# Patient Record
Sex: Female | Born: 1975 | Race: White | Hispanic: No | Marital: Single | Smoking: Current every day smoker
Health system: Southern US, Community
[De-identification: ages and names within clinical notes are randomized; demographics above are authoritative.]

## PROBLEM LIST (undated history)

## (undated) DIAGNOSIS — E039 Hypothyroidism, unspecified: Secondary | ICD-10-CM

## (undated) DIAGNOSIS — G8929 Other chronic pain: Secondary | ICD-10-CM

## (undated) DIAGNOSIS — R102 Pelvic and perineal pain: Secondary | ICD-10-CM

## (undated) DIAGNOSIS — M722 Plantar fascial fibromatosis: Secondary | ICD-10-CM

## (undated) DIAGNOSIS — K219 Gastro-esophageal reflux disease without esophagitis: Secondary | ICD-10-CM

## (undated) DIAGNOSIS — F329 Major depressive disorder, single episode, unspecified: Secondary | ICD-10-CM

## (undated) DIAGNOSIS — F32A Depression, unspecified: Secondary | ICD-10-CM

## (undated) DIAGNOSIS — K760 Fatty (change of) liver, not elsewhere classified: Secondary | ICD-10-CM

## (undated) DIAGNOSIS — IMO0002 Reserved for concepts with insufficient information to code with codable children: Secondary | ICD-10-CM

## (undated) DIAGNOSIS — R0789 Other chest pain: Secondary | ICD-10-CM

## (undated) DIAGNOSIS — E119 Type 2 diabetes mellitus without complications: Secondary | ICD-10-CM

## (undated) DIAGNOSIS — N809 Endometriosis, unspecified: Secondary | ICD-10-CM

## (undated) DIAGNOSIS — E785 Hyperlipidemia, unspecified: Secondary | ICD-10-CM

## (undated) HISTORY — DX: Gastro-esophageal reflux disease without esophagitis: K21.9

## (undated) HISTORY — DX: Major depressive disorder, single episode, unspecified: F32.9

## (undated) HISTORY — PX: WISDOM TOOTH EXTRACTION: SHX21

## (undated) HISTORY — DX: Fatty (change of) liver, not elsewhere classified: K76.0

## (undated) HISTORY — PX: OTHER SURGICAL HISTORY: SHX169

## (undated) HISTORY — DX: Type 2 diabetes mellitus without complications: E11.9

## (undated) HISTORY — DX: Plantar fascial fibromatosis: M72.2

## (undated) HISTORY — PX: CHOLECYSTECTOMY: SHX55

## (undated) HISTORY — PX: TUBAL LIGATION: SHX77

## (undated) HISTORY — DX: Depression, unspecified: F32.A

## (undated) HISTORY — DX: Other chest pain: R07.89

## (undated) HISTORY — DX: Hyperlipidemia, unspecified: E78.5

## (undated) HISTORY — DX: Hypothyroidism, unspecified: E03.9

---

## 2001-02-11 ENCOUNTER — Encounter: Payer: Self-pay | Admitting: General Surgery

## 2001-02-16 ENCOUNTER — Inpatient Hospital Stay (HOSPITAL_COMMUNITY): Admission: RE | Admit: 2001-02-16 | Discharge: 2001-02-17 | Payer: Self-pay | Admitting: General Surgery

## 2001-02-16 ENCOUNTER — Encounter (INDEPENDENT_AMBULATORY_CARE_PROVIDER_SITE_OTHER): Payer: Self-pay

## 2002-01-22 ENCOUNTER — Other Ambulatory Visit: Admission: RE | Admit: 2002-01-22 | Discharge: 2002-01-22 | Payer: Self-pay | Admitting: Obstetrics and Gynecology

## 2002-12-20 ENCOUNTER — Ambulatory Visit (HOSPITAL_COMMUNITY): Admission: RE | Admit: 2002-12-20 | Discharge: 2002-12-20 | Payer: Self-pay | Admitting: Obstetrics and Gynecology

## 2002-12-28 ENCOUNTER — Inpatient Hospital Stay (HOSPITAL_COMMUNITY): Admission: AD | Admit: 2002-12-28 | Discharge: 2002-12-31 | Payer: Self-pay | Admitting: Obstetrics and Gynecology

## 2003-08-31 ENCOUNTER — Ambulatory Visit (HOSPITAL_COMMUNITY): Admission: RE | Admit: 2003-08-31 | Discharge: 2003-08-31 | Payer: Self-pay | Admitting: Obstetrics and Gynecology

## 2003-12-01 ENCOUNTER — Emergency Department (HOSPITAL_COMMUNITY): Admission: EM | Admit: 2003-12-01 | Discharge: 2003-12-02 | Payer: Self-pay | Admitting: Emergency Medicine

## 2004-05-01 ENCOUNTER — Ambulatory Visit (HOSPITAL_COMMUNITY): Admission: RE | Admit: 2004-05-01 | Discharge: 2004-05-01 | Payer: Self-pay | Admitting: Obstetrics & Gynecology

## 2005-09-04 ENCOUNTER — Ambulatory Visit (HOSPITAL_COMMUNITY): Admission: RE | Admit: 2005-09-04 | Discharge: 2005-09-04 | Payer: Self-pay | Admitting: Internal Medicine

## 2007-09-08 ENCOUNTER — Ambulatory Visit: Payer: Self-pay | Admitting: Cardiology

## 2008-03-08 ENCOUNTER — Encounter: Admission: RE | Admit: 2008-03-08 | Discharge: 2008-03-08 | Payer: Self-pay | Admitting: General Surgery

## 2010-11-10 ENCOUNTER — Encounter: Payer: Self-pay | Admitting: Obstetrics and Gynecology

## 2011-01-08 ENCOUNTER — Other Ambulatory Visit (HOSPITAL_COMMUNITY): Payer: Self-pay | Admitting: *Deleted

## 2011-01-08 DIAGNOSIS — N6452 Nipple discharge: Secondary | ICD-10-CM

## 2011-01-08 DIAGNOSIS — N63 Unspecified lump in unspecified breast: Secondary | ICD-10-CM

## 2011-01-16 ENCOUNTER — Other Ambulatory Visit (HOSPITAL_COMMUNITY): Payer: Self-pay | Admitting: *Deleted

## 2011-01-16 ENCOUNTER — Ambulatory Visit (HOSPITAL_COMMUNITY)
Admission: RE | Admit: 2011-01-16 | Discharge: 2011-01-16 | Disposition: A | Discharge status: Home / Self Care | Payer: Medicaid Other | Source: Ambulatory Visit | Attending: *Deleted | Admitting: *Deleted

## 2011-01-16 DIAGNOSIS — N6452 Nipple discharge: Secondary | ICD-10-CM

## 2011-01-16 DIAGNOSIS — N63 Unspecified lump in unspecified breast: Secondary | ICD-10-CM
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
## 2011-03-08 NOTE — H&P (Signed)
NAME:  Jaime Carter, SIGNER                            ACCOUNT NO.:  [id number]   MEDICAL RECORD NO.:  [id number]                   PATIENT TYPE:   LOCATION:  LD2                                  FACILITY:  [facility]   PHYSICIAN:  Tilda Burrow, M.D.              DATE OF BIRTH:  1976-07-14   DATE OF ADMISSION:  12/28/2002  DATE OF DISCHARGE:                                HISTORY & PHYSICAL   ADMITTING DIAGNOSIS:  Pregnancy 39 weeks 2 days, elective induction, and as  per patient preference, desire for elective permanent sterilization.   HISTORY OF PRESENT ILLNESS:  This 35 year old female, gravida 4, para 3, AB  0, LMP __________, was admitted at this time for an elective induction. She  is admitted at this time after requesting evaluation of cervix which has  been found to be quite anterior, 2 cm dilated, but quite long and solid.  The patient has been counseled over the options of induction and description  of balloon cervical dilation reviewed with the patient. She requested  induction which has been scheduled for 12/28/2002 cervical ripening and  12/29/2002 probable vaginal delivery.   PRIOR OB HISTORY:  Notable for three deliveries at or more weeks gestation  with two inductions due to approaching post dates. Her 1998 induction was  also complicated by oligohydramnios but delivered vaginally. She has had a  history of poor compliance with prenatal care and has had one pregnancy with  only three prenatal visits.   PAST MEDICAL HISTORY:  Notable for depressive tendency with the patient  currently Lexapro 10 mg daily. She has GYN history of abnormal Pap with high  risk virus on DNA typing. Pap smear August 2003 was normal. She plans tubal  ligation. She has had epidurals with all three prior pregnancies. The  patient has been made aware that speed of induction cannot be guaranteed.  The cervix appears satisfactorily favorable.   PAST MEDICAL HISTORY:  Benign.   HABITS:  Cigarettes 1  1/2 packs per day prior to pregnancy, 1/2 pack per day  during the pregnancy, negative for alcohol or recreational drugs.   GYN HISTORY:  History of colposcopy by [facility] with high grade virus noted.  She did not return for treatment prior to the pregnancy. This will be  evaluated postpartum by colposcopy.   PHYSICAL EXAMINATION:  VITAL SIGNS:  Height 5 feet, 2, weight 213, blood  pressure 110/68.  GENERAL:  This is a somber, relatively quiet Caucasian female alert and  oriented x3.  HEENT:  Pupils equal, round and reactive. Trachea midline.  CARDIOVASCULAR:  Unremarkable.  ABDOMEN:  39 cm, presentation vertex. Cervix anterior 2 cm long.    PRENATAL LABS:  Blood type A-positive, antibody screen negative. Hemoglobin  15, hematocrit 46 initially. Rubella immune, Ng present, RPR, GC, Chlamydia  and urine drug screen are all negative. Hepatitis negative as was HIV.  GC  and  Chlamydia negative.  __________ normal.   PLAN:  Balloon cervical ripening on Tuesday evening and Pitocin induction in  a.m. on Wednesday, 12/29/2002.                                                Tilda Burrow, M.D.    JVF/MEDQ  D:  12/28/2002  T:  12/28/2002  Job:  [id number]

## 2011-03-08 NOTE — Op Note (Signed)
   NAME:  JAQUELINE, UBER                            ACCOUNT NO.:  [id number]   MEDICAL RECORD NO.:  [id number]                   PATIENT TYPE:  INP   LOCATION:  A425                                 FACILITY:  [facility]   PHYSICIAN:  Lazaro Arms, M.D.                DATE OF BIRTH:  12-22-1975   DATE OF PROCEDURE:  12/29/2002  DATE OF DISCHARGE:                                 OPERATIVE REPORT   PROCEDURE:  Epidural.   DESCRIPTION OF PROCEDURE:  The patient is a 35 year old gravida 4, para 3 in  active labor requesting an epidural.  She was placed in the sitting  position.  The iliac crests were identified.  The L2-3 and L3-4 interspaces  were identified and marked.  The area was Betadine prepped.  The field was  draped.  Lidocaine 1% was injected as a skin wheal for local anesthetic.  A  17-gauge Tuohy needle was introduced in the L2-L3 interspace.  Loss of  resistance technique was employed, and the epidural space was found without  difficulty.  Bupivacaine 0.125% 10 cc was given as a test dose.  The  epidural catheter was threaded without difficulty.  An additional 10 cc of  0.125% bupivacaine was given again without incident.  It was taped down.  The continuous pump was placed and a 9 cc bolus of 0.125% bupivacaine with 2  mcg per cc of Fentanyl and then 12 cc an hour for continuous epidural  infusion.  The patient tolerated the procedure well.  She had no adverse  effects.  Blood pressure was stable, and there was a reassuring fetal heart  rate tracing throughout.                                               Lazaro Arms, M.D.    Loraine Maple  D:  01/05/2003  T:  01/05/2003  Job:  [id number]

## 2011-03-08 NOTE — Op Note (Signed)
   NAME:  Jaime Carter, Jaime Carter                            ACCOUNT NO.:  [id number]   MEDICAL RECORD NO.:  [id number]                   PATIENT TYPE:  INP   LOCATION:  LDR2                                 FACILITY:  [facility]   PHYSICIAN:  Tilda Burrow, M.D.              DATE OF BIRTH:  Jun 20, 1976   DATE OF PROCEDURE:  12/29/2002  DATE OF DISCHARGE:                                  PROCEDURE NOTE   LENGTH OF FIRST STAGE OF LABOR:  3 hours and 20 minutes.   LENGTH OF SECOND STAGE OF LABOR:  10 minutes.   LENGTH OF THIRD STAGE OF LABOR:  3 minutes.   DELIVERY NOTE:  After external version this a.m., Pitocin was started on the  patient and she progressed rapidly to 5-6 cm.  Lazaro Arms, M.D., placed  epidural anesthesia with good pain management result.  The patient went on  to complete at 1435 hours and delivered at 1445 hours.  The patient had  spontaneous delivery of head.  Upon spontaneous delivery of head, the nose  and mouth were thoroughly suctioned with DeLee suction and the infant  delivered spontaneously without any difficulties.  Upon inspection, the  perineum was intact.  The coronary disease was clamped and cut.  The infant  was placed on the mother's abdomen in good condition.  The third stage of  labor was actively managed with 20 units of Pitocin and 1000 mL of D5 LR at  a rapid rate.  The placenta delivered spontaneously via Tomasa Blase mechanism.  A three-vessel cord was noted.  Membranes were noted to be intact.  The  patient and infant were stabilized in good condition.  The epidural catheter  was removed with the blue chip intact.  Mom and infant transferred out to  the postpartum unit in good condition.     Zerita Boers, Reita Cliche, M.D.    DL/MEDQ  D:  16/07/9603  T:  12/29/2002  Job:  [id number]   cc:   [facility]

## 2011-03-08 NOTE — Op Note (Signed)
NAME:  Jaime Carter, Jaime Carter                            ACCOUNT NO.:  [id number]   MEDICAL RECORD NO.:  [id number]                   PATIENT TYPE:  AMB   LOCATION:  DAY                                  FACILITY:  [facility]   PHYSICIAN:  Lazaro Arms, M.D.                DATE OF BIRTH:  1975-11-18   DATE OF PROCEDURE:  05/01/2004  DATE OF DISCHARGE:                                 OPERATIVE REPORT   PREOPERATIVE DIAGNOSIS:  Multiparous female desires permanent sterilization.   POSTOPERATIVE DIAGNOSIS:  Multiparous female desires permanent  sterilization.   PROCEDURE:  Laparoscopic tubal ligation.   SURGEON:  Lazaro Arms, M.D.   ANESTHESIA:  General endotracheal.   FINDINGS:  The patient had a normal uterus, tubes, and ovaries.  Normal  intraperitoneal cavity.   DESCRIPTION OF PROCEDURE:  The patient was taken to the operating room and  placed in the supine position where she underwent general endotracheal  anesthesia and was placed in the dorsal lithotomy position and prepped and  draped in the usual sterile fashion.  The bladder was drained.   An incision was made in the umbilicus, and the Veress needle was placed in  the peritoneal cavity with one pass without difficulty.  The peritoneal  cavity was confirmed, and the peritoneal cavity was insufflated.  A  nonbladed direct visual trocar was used and under direct visualization was  placed in the peritoneal cavity with one pass without difficulty.  The  peritoneal cavity was viewed and insufflated.  The fallopian tubes were  identified, burned to no resistance and beyond bilaterally in the distal  isthmic ampullary region of each tube approximately 3 cm bilaterally.   The patient tolerated the procedure well.  She experienced no blood loss.  At this point, the instruments were removed.  The fascia was closed with a  single 0 Vicryl suture after the gas was allowed to escape.  The  subcutaneous tissue was closed with 3-0 Vicryl, and the  skin was closed  using skin staples.  Marcaine 0.5% was injected as a local anesthetic.  She  was taken to the recovery room in good and stable condition.  All counts  were correct.      ___________________________________________                                            Lazaro Arms, M.D.   LHE/MEDQ  D:  05/01/2004  T:  05/01/2004  Job:  [id number]

## 2011-03-08 NOTE — Op Note (Signed)
[facility]  Patient:    Jaime Carter, Jaime Carter                         MRN: [id number] Proc. Date: 02/16/01 Adm. Date:  [id number] Disc. Date: [id number] Attending:  Caleen Essex                           Operative Report  PREOPERATIVE DIAGNOSIS:  Cholelithiasis.  POSTOPERATIVE DIAGNOSIS:  Cholelithiasis.  PROCEDURE:  Laparoscopic cholecystectomy.  SURGEON:  Ollen Gross. Vernell Morgans, M.D.  ASSISTANT:  Angelia Mould. Derrell Lolling, M.D.  ANESTHESIA:  General endotracheal.  DESCRIPTION OF PROCEDURE:  After informed consent was obtained, the patient was brought to the operating room and placed in a supine position on the operating table.  After induction of adequate general anesthesia, the patients abdomen was prepped with Betadine and draped in the usual sterile manner.  A small transverse supraumbilical incision was made with a 15 blade knife after infiltrating this area with 0.25% Marcaine.  This incision was carried down through the skin and subcutaneous tissue using the blunt dissection with a Kelly clamp and Army-Navy retractors until the linea alba was identified.  The linea alba was incised with the 15 blade knife, and each side was grasped with Kocher clamps and elevated anteriorly.  The preperitoneal space was then probed bluntly with a hemostat until the peritoneum was opened and access was gained to the abdominal cavity.  A finger was inserted through this hole for palpation of the anterior abdominal wall, and there were no apparent adhesions noted.  A 0 Vicryl pursestring stitch was then placed in the fascia surrounding this hole.  The Hasson cannula was placed through this opening and anchored with the previously placed Vicryl pursestring stitch.  The abdomen was then insufflated with carbon dioxide without difficulty.  A laparoscope was placed through the Hasson cannula and on inspection of the abdomen, the liver edge and dome of the gallbladder were readily  identifiable.  A small transverse upper midline incision was made after infiltrating this area with 0.25% Marcaine.  A 10 mm trocar was placed through this incision bluntly into the abdominal cavity under direct vision. Two places were chosen laterally on the right side of the abdominal wall below the costal margin for placement of the 5 mm ports.  These areas were infiltrated with 0.25% Marcaine, and small stab incisions were made with the 15 blade knife.  Two 5 mm ports were placed through these incisions bluntly into the abdominal cavity under direct vision.  A blunt grasper was placed through the lateral-most 5 mm port and used to grasp the dome of the gallbladder and elevate it anteriorly and superiorly.  Another blunt grasper was placed through the other 5 mm port and used to retract on the body and neck of the gallbladder.  A [place] dissector was placed through the upper midline port and using a combination of blunt dissection and electrocautery, the peritoneal reflection over top of the neck of the gallbladder area was opened, and blunt dissection was carried out until the gallbladder neck cystic duct junction was readily identifiable.  Once this area was identified, the dissection was carried out circumferentially around the cystic duct and gallbladder neck junction.  Care was taken during this dissection to keep the common duct medial to this.  Three clips were then placed proximally and one distally on the cystic duct, and the cystic  duct was divided between the two. Posterior to this structure, the cystic artery was identified and again was dissected in a circumferential manner using the [place].  Two clips were placed proximally and one distally on the cystic artery, and the artery was divided between the two.  Next a laparoscopic L cautery was used to remove the gallbladder from the liver bed.  Prior to completely removing the gallbladder from the liver bed, the  liver bed was inspected, and several small bleeding points required electrocautery for hemostasis.  The rest of the gallbladder was then removed from the liver bed with the spatula electrocautery.  The camera was then moved to the upper midline port, and a gallbladder grasper was placed through the Hasson cannula and used to grasp the neck of the gallbladder.  The gallbladder was then removed with the Hasson cannula through the supraumbilical port.  The laparoscope was replaced into the Hasson cannula.  The abdomen was inspected.  The liver bed was evaluated and was found to be hemostatic.  The abdomen was then irrigated with copious amounts of saline until the effluent was clear.  The ports were then removed under direct vision and found to be hemostatic.  The fascia of the supraumbilical port was closed with the previously placed Vicryl pursestring stitch.  The skin incisions were then all closed with 4-0 Monocryl subcuticular interrupted stitches.  Benzoin and Steri-Strips were applied. The patient tolerated the procedure well.  At the end of the case, all sponge, needle and instrument counts were correct.  The patient was awakened and taken to the recovery room in stable condition. DD:  02/19/01 TD:  02/19/01 Job: [id number]

## 2011-05-24 ENCOUNTER — Ambulatory Visit: Payer: Medicaid Other | Admitting: Cardiology

## 2011-06-06 ENCOUNTER — Encounter: Payer: Self-pay | Admitting: Cardiology

## 2011-06-06 ENCOUNTER — Ambulatory Visit: Payer: Medicaid Other | Admitting: Cardiology

## 2012-08-11 ENCOUNTER — Encounter (HOSPITAL_COMMUNITY): Payer: Self-pay

## 2012-08-11 ENCOUNTER — Emergency Department (HOSPITAL_COMMUNITY)
Admission: EM | Admit: 2012-08-11 | Discharge: 2012-08-11 | Disposition: A | Discharge status: Home / Self Care | Payer: Medicaid Other | Attending: Emergency Medicine | Admitting: Emergency Medicine

## 2012-08-11 DIAGNOSIS — Z79899 Other long term (current) drug therapy: Secondary | ICD-10-CM | POA: Insufficient documentation

## 2012-08-11 DIAGNOSIS — K7689 Other specified diseases of liver: Secondary | ICD-10-CM | POA: Insufficient documentation

## 2012-08-11 DIAGNOSIS — K219 Gastro-esophageal reflux disease without esophagitis: Secondary | ICD-10-CM | POA: Insufficient documentation

## 2012-08-11 DIAGNOSIS — Z87891 Personal history of nicotine dependence: Secondary | ICD-10-CM | POA: Insufficient documentation

## 2012-08-11 DIAGNOSIS — E119 Type 2 diabetes mellitus without complications: Secondary | ICD-10-CM | POA: Insufficient documentation

## 2012-08-11 DIAGNOSIS — H6092 Unspecified otitis externa, left ear: Secondary | ICD-10-CM

## 2012-08-11 DIAGNOSIS — E785 Hyperlipidemia, unspecified: Secondary | ICD-10-CM | POA: Insufficient documentation

## 2012-08-11 DIAGNOSIS — Z8659 Personal history of other mental and behavioral disorders: Secondary | ICD-10-CM | POA: Insufficient documentation

## 2012-08-11 DIAGNOSIS — E039 Hypothyroidism, unspecified: Secondary | ICD-10-CM | POA: Insufficient documentation

## 2012-08-11 DIAGNOSIS — H60399 Other infective otitis externa, unspecified ear: Secondary | ICD-10-CM | POA: Insufficient documentation

## 2012-08-11 LAB — GLUCOSE, CAPILLARY: Glucose-Capillary: 223 mg/dL — ABNORMAL HIGH (ref 70–99)

## 2012-08-11 MED ORDER — OXYCODONE-ACETAMINOPHEN 5-325 MG PO TABS: 1.0000 | ORAL_TABLET | ORAL | Status: DC | PRN

## 2012-08-11 MED ORDER — OXYCODONE-ACETAMINOPHEN 5-325 MG PO TABS
1.0000 | ORAL_TABLET | Freq: Once | ORAL | Status: AC
  Administered 2012-08-11: 1 via ORAL
  Filled 2012-08-11: qty 1

## 2012-08-11 MED ORDER — NEOMYCIN-POLYMYXIN-HC 3.5-10000-1 OT SOLN
3.0000 [drp] | Freq: Once | OTIC | Status: AC
  Administered 2012-08-11: 3 [drp] via OTIC
  Filled 2012-08-11: qty 10
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
## 2012-08-11 NOTE — ED Notes (Signed)
Pt c/o left earache with drainage x 3 days.

## 2012-08-11 NOTE — ED Provider Notes (Signed)
Medical screening examination/treatment/procedure(s) were performed by non-physician practitioner and as supervising physician I was immediately available for consultation/collaboration.   Shelda Jakes, MD 08/11/12 2312

## 2012-12-05 ENCOUNTER — Emergency Department (HOSPITAL_COMMUNITY)
Admission: EM | Admit: 2012-12-05 | Discharge: 2012-12-05 | Disposition: A | Discharge status: Home / Self Care | Payer: Medicaid Other | Attending: Emergency Medicine | Admitting: Emergency Medicine

## 2012-12-05 ENCOUNTER — Encounter (HOSPITAL_COMMUNITY): Payer: Self-pay | Admitting: Emergency Medicine

## 2012-12-05 DIAGNOSIS — E039 Hypothyroidism, unspecified: Secondary | ICD-10-CM | POA: Insufficient documentation

## 2012-12-05 DIAGNOSIS — E119 Type 2 diabetes mellitus without complications: Secondary | ICD-10-CM | POA: Insufficient documentation

## 2012-12-05 DIAGNOSIS — F329 Major depressive disorder, single episode, unspecified: Secondary | ICD-10-CM | POA: Insufficient documentation

## 2012-12-05 DIAGNOSIS — Z87891 Personal history of nicotine dependence: Secondary | ICD-10-CM | POA: Insufficient documentation

## 2012-12-05 DIAGNOSIS — B9689 Other specified bacterial agents as the cause of diseases classified elsewhere: Secondary | ICD-10-CM

## 2012-12-05 DIAGNOSIS — E782 Mixed hyperlipidemia: Secondary | ICD-10-CM | POA: Insufficient documentation

## 2012-12-05 DIAGNOSIS — R102 Pelvic and perineal pain: Secondary | ICD-10-CM

## 2012-12-05 DIAGNOSIS — M722 Plantar fascial fibromatosis: Secondary | ICD-10-CM | POA: Insufficient documentation

## 2012-12-05 DIAGNOSIS — N760 Acute vaginitis: Secondary | ICD-10-CM | POA: Insufficient documentation

## 2012-12-05 DIAGNOSIS — F3289 Other specified depressive episodes: Secondary | ICD-10-CM | POA: Insufficient documentation

## 2012-12-05 DIAGNOSIS — K7689 Other specified diseases of liver: Secondary | ICD-10-CM | POA: Insufficient documentation

## 2012-12-05 DIAGNOSIS — K219 Gastro-esophageal reflux disease without esophagitis: Secondary | ICD-10-CM | POA: Insufficient documentation

## 2012-12-05 LAB — COMPREHENSIVE METABOLIC PANEL
Albumin: 4.4 g/dL (ref 3.5–5.2)
BUN: 7 mg/dL (ref 6–23)
Calcium: 10.5 mg/dL (ref 8.4–10.5)
Chloride: 95 mEq/L — ABNORMAL LOW (ref 96–112)
Creatinine, Ser: 0.76 mg/dL (ref 0.50–1.10)
GFR calc non Af Amer: >90 mL/min (ref >90)
Total Bilirubin: 0.7 mg/dL (ref 0.3–1.2)

## 2012-12-05 LAB — CBC WITH DIFFERENTIAL/PLATELET
Basophils Absolute: 0.1 10*3/uL (ref 0.0–0.1)
Basophils Relative: 1 % (ref 0–1)
Eosinophils Relative: 3 % (ref 0–5)
HCT: 45.7 % (ref 36.0–46.0)
Hemoglobin: 15.9 g/dL — ABNORMAL HIGH (ref 12.0–15.0)
MCH: 28.9 pg (ref 26.0–34.0)
MCHC: 34.8 g/dL (ref 30.0–36.0)
MCV: 82.9 fL (ref 78.0–100.0)
Monocytes Absolute: 0.6 10*3/uL (ref 0.1–1.0)
Monocytes Relative: 6 % (ref 3–12)
Neutro Abs: 4.2 10*3/uL (ref 1.7–7.7)
RDW: 13.3 % (ref 11.5–15.5)

## 2012-12-05 LAB — URINALYSIS, ROUTINE W REFLEX MICROSCOPIC
Bilirubin Urine: NEGATIVE
Ketones, ur: NEGATIVE mg/dL
Leukocytes, UA: NEGATIVE
Nitrite: NEGATIVE
Protein, ur: NEGATIVE mg/dL
Urobilinogen, UA: 0.2 mg/dL (ref 0.0–1.0)
pH: 6.5 (ref 5.0–8.0)

## 2012-12-05 LAB — WET PREP, GENITAL: Yeast Wet Prep HPF POC: NONE SEEN

## 2012-12-05 LAB — URINE MICROSCOPIC-ADD ON

## 2012-12-05 MED ORDER — METRONIDAZOLE 500 MG PO TABS
ORAL_TABLET | ORAL | Status: AC
  Administered 2012-12-05: 500 mg
  Filled 2012-12-05: qty 1

## 2012-12-05 MED ORDER — ONDANSETRON 8 MG PO TBDP
ORAL_TABLET | ORAL | Status: AC
  Administered 2012-12-05: 8 mg
  Filled 2012-12-05: qty 1

## 2012-12-05 MED ORDER — METRONIDAZOLE 500 MG PO TABS: 500.0000 mg | ORAL_TABLET | Freq: Two times a day (BID) | ORAL | Status: DC
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
## 2012-12-05 NOTE — ED Notes (Signed)
EDNP in to see pt for initial evaluation.

## 2012-12-05 NOTE — ED Provider Notes (Signed)
No exam changes noted. Cmet - Glucose elevated at 271, AST elevated182, ALT elevated 166. Pt states her liver function test are chronically elevated. CBC non-acute. Wet Prep. Mod clue cells, WBC's present. Urine preg -negative. All findings discussed with patient. Plan - Pt advised to see MD at [facility] for additional evaluation of pain and pap smear. Rx for flagyl given to the patient for BV. Pt to return if any emergent changes or problem.   Kathie Dike, [place] 12/05/12 [phone]

## 2012-12-05 NOTE — ED Provider Notes (Signed)
  Medical screening examination/treatment/procedure(s) were performed by non-physician practitioner and as supervising physician I was immediately available for consultation/collaboration.    Vida Roller, MD 12/05/12 2134

## 2012-12-05 NOTE — ED Notes (Signed)
Pt c/o pelvic pain and clear/white/yellow vaginal d/c.

## 2012-12-06 LAB — GC/CHLAMYDIA PROBE AMP
CT Probe RNA: NEGATIVE
GC Probe RNA: NEGATIVE

## 2012-12-06 NOTE — ED Provider Notes (Signed)
Medical screening examination/treatment/procedure(s) were performed by non-physician practitioner and as supervising physician I was immediately available for consultation/collaboration.   Zayde Stroupe, MD 12/06/12 0012 

## 2013-02-25 ENCOUNTER — Encounter: Payer: Self-pay | Admitting: *Deleted

## 2013-02-25 DIAGNOSIS — R102 Pelvic and perineal pain: Secondary | ICD-10-CM | POA: Insufficient documentation

## 2013-02-25 DIAGNOSIS — N809 Endometriosis, unspecified: Secondary | ICD-10-CM | POA: Insufficient documentation

## 2013-03-09 ENCOUNTER — Encounter: Payer: Self-pay | Admitting: Obstetrics & Gynecology

## 2013-03-09 ENCOUNTER — Ambulatory Visit (INDEPENDENT_AMBULATORY_CARE_PROVIDER_SITE_OTHER): Payer: Medicaid Other | Admitting: Obstetrics & Gynecology

## 2013-03-09 VITALS — BP 110/78 | Ht 63.0 in | Wt 212.0 lb

## 2013-03-09 DIAGNOSIS — IMO0002 Reserved for concepts with insufficient information to code with codable children: Secondary | ICD-10-CM

## 2013-03-09 DIAGNOSIS — N946 Dysmenorrhea, unspecified: Secondary | ICD-10-CM

## 2013-03-09 DIAGNOSIS — N920 Excessive and frequent menstruation with regular cycle: Secondary | ICD-10-CM

## 2013-03-09 MED ORDER — MEGESTROL ACETATE 40 MG PO TABS: 40.0000 mg | ORAL_TABLET | Freq: Every day | ORAL | Status: DC

## 2013-03-09 MED ORDER — CYCLOBENZAPRINE HCL 10 MG PO TABS: 10.0000 mg | ORAL_TABLET | Freq: Three times a day (TID) | ORAL | Status: DC | PRN

## 2013-03-10 ENCOUNTER — Telehealth: Payer: Self-pay | Admitting: Obstetrics & Gynecology

## 2013-03-16 NOTE — Telephone Encounter (Signed)
Continue megace, no viable alternative, message sent

## 2013-03-17 NOTE — Telephone Encounter (Signed)
Pt informed to continue Megace as prescribed by Dr. Despina Hidden, should not cause appetite increase at 40 mg daily. Pt verbalized understanding.

## 2013-03-18 NOTE — Progress Notes (Signed)
Patient ID: Jaime Carter, female   DOB: 08-05-1976, 37 y.o.   MRN: [id number] Pt with increasingly heavy menses, dysmenorrhea dyspareunia Had suggestion that could be endometriosis Never had a scope, just clinically suggested  Exam Normal EFG Vagina Normal Vulva normal  Uterus normal size Adnexa Normal size non tender  Place on megace Sonogram 1 month Evaluate for ablation

## 2013-04-09 ENCOUNTER — Ambulatory Visit (INDEPENDENT_AMBULATORY_CARE_PROVIDER_SITE_OTHER): Payer: Medicaid Other

## 2013-04-09 ENCOUNTER — Ambulatory Visit (INDEPENDENT_AMBULATORY_CARE_PROVIDER_SITE_OTHER): Payer: Medicaid Other | Admitting: Obstetrics & Gynecology

## 2013-04-09 ENCOUNTER — Encounter: Payer: Self-pay | Admitting: Obstetrics & Gynecology

## 2013-04-09 VITALS — BP 122/90 | Wt 207.0 lb

## 2013-04-09 DIAGNOSIS — N920 Excessive and frequent menstruation with regular cycle: Secondary | ICD-10-CM

## 2013-04-09 DIAGNOSIS — IMO0002 Reserved for concepts with insufficient information to code with codable children: Secondary | ICD-10-CM | POA: Insufficient documentation

## 2013-04-09 DIAGNOSIS — N946 Dysmenorrhea, unspecified: Secondary | ICD-10-CM

## 2013-04-09 NOTE — Patient Instructions (Signed)
Dyspareunia Dyspareunia is pain during sexual intercourse. It is most common in women, but it also happens in men.  CAUSES  Female The pain from this condition is usually felt when anything is put into the vagina, but any part of the genitals may cause pain during sex. Even sitting or wearing pants can cause pain. Sometimes, a cause cannot be found. Some causes of pain during intercourse are:  Infections of the skin around the vagina.  Vaginal infections, such as a yeast, bacterial, or viral infection.  Vaginismus. This is the inability to have anything put in the vagina even when the woman wants it to happen. There is an automatic muscle contraction and pain. The pain of the muscle contraction can be so severe that intercourse is impossible.  Allergic reaction from spermicides, semen, condoms, scented tampons, soaps, douches, and vaginal sprays.  A fluid-filled sac (cyst) on the Bartholin or Skene glands, located at the opening of the vagina.  Scar tissue in the vagina from a surgically enlarged opening (episiotomy) or tearing after delivering a baby.  Vaginal dryness. This is more common in menopause. The normal secretions of the vagina are decreased. Changes in estrogen levels and increased difficulty becoming aroused can cause painful sex. Vaginal dryness can also happen when taking birth control pills.  Thinning of the tissue (atrophy) of the vulva and vagina. This makes the area thinner, smaller, unable to stretch to accommodate a penis, and prone to infection and tearing.  Vulvar vestibulitis or vestibulodynia.This is a condition that causes pain involving the area around the entrance to the vagina.The most common cause in young women is birth control pills.Women with low estrogen levels (postmenopausal women) may also experience this.Other causes include allergic reactions, too many nerve endings, skin conditions, and pelvic muscles that cannot relax.  Vulvar dermatoses. This  includes skin conditions such as lichen sclerosus and lichen planus.  Lack of foreplay to lubricate the vagina. This can cause vaginal dryness.  Noncancerous tumors (fibroids) in the uterus.  Uterus lining tissue growing outside the uterus (endometriosis).  Pregnancy that starts in the fallopian tube (tubal pregnancy).  Pregnancy or breastfeeding your baby. This can cause vaginal dryness.  A tilting or prolapse of the uterus. Prolapse is when weak and stretched muscles around the uterus allow it to fall into the vagina.  Problems with the ovaries, cysts, or scar tissue. This may be worse with certain sexual positions.  Previous surgeries causing adhesions or scar tissue in the vagina or pelvis.  Bladder and intestinal problems.  Psychological problems (such as depression or anxiety). This may make pain worse.  Negative attitudes about sex, experiencing rape, sexual assault, and misinformation about sex. These issues are often related to some types of pain.  Previous pelvic infection, causing scar tissue in the pelvis and on the female organs.  Cyst or tumor on the ovary.  Cancer of the female organs.  Certain medicines.  Medical problems such as diabetes, arthritis, or thyroid disease. Female In men, there are many physical causes of sexual discomfort. Some causes of pain during intercourse are:  Infections of the prostate, bladder, or seminal vesicles. This can cause pain after ejaculation.  An inflamed bladder (interstitial cystitis). This may cause pain from ejaculation.  Gonorrheal infections. This may cause pain during ejaculation.  An inflamed urethra (urethritis) or inflamed prostate (prostatitis). This can make genital stimulation painful or uncomfortable.  Deformities of the penis, such as Peyronie's disease.  A tight foreskin.  Cancer of the female organs.    Psychological problems. This may make pain worse. DIAGNOSIS   Your caregiver will take a history and  have you describe where the pain is located (outside the vagina, in the vagina, in the pelvis). You may be asked when you experience pain, such as with penetration or with thrusting.  Following this, your caregiver will do a physical exam. Let your caregiver know if the exam is too painful.  During the final part of the female exam, your caregiver will feel your uterus and ovaries with one hand on the abdomen and one finger in your vagina. This is a pelvic exam.  Blood tests, a Pap test, cultures for infection, an ultrasound test, and X-rays may be done. You may need to see a specialist for female problems (gynecologist).  Your caregiver may do a CT scan, MRI, or laparoscopy. Laparoscopy is a procedure to look into the pelvis with a lighted tube, through a cut (incision) in the abdomen. TREATMENT  Your caregiver can help you determine the best course of treatment. Sometimes, more testing is done. Continue with the suggested testing until your caregiver feels sure about your diagnosis and how to treat it. Sometimes, it is difficult to find the reason for the pain. The search for the cause and treatment can be frustrating. Treatment often takes several weeks to a few months before you notice any improvement. You may also need to avoid sexual activity until symptoms improve.Continuing to have sex when it hurts can delay healing and actually make the problem worse. The treatment depends on the cause of the pain. Treatment may include:  Medicines such as antibiotics, vaginal or skin creams, hormones, or antidepressants.  Minor or major surgery.  Psychological counseling or group therapy.  Kegel exercises and vaginal dilators to help certain cases of vaginismus (spasms). Do this only if recommended by your caregiver.Kegel exercises can make some problems worse.  Applying lubrication as recommended by your caregiver if you have dryness.  Sex therapy for you and your sex partner. It is common for  the pain to continue after the reason for the pain has been treated. Some reasons for this include a conditioned response. This means the person having the pain becomes so familiar with the pain that the pain continues as a response, even though the cause is removed. Sex therapy can help with this problem. HOME CARE INSTRUCTIONS   Follow your caregiver's instructions about taking medicines, tests, counseling, and follow-up treatment.  Do not use scented tampons, douches, vaginal sprays, or soaps.  Use water-based lubricants for dryness. Oil lubricants can cause irritation.  Do not use spermicides or condoms that irritate you.  Openly discuss with your partner your sexual experience, your desires, foreplay, and different sexual positions for a more comfortable and enjoyable sexual relationship.  Join group sessions for therapy, if needed.  Practice safe sex at all times.  Empty your bladder before having intercourse.  Try different positions during sexual intercourse.  Take over-the-counter pain medicine recommended by your caregiver before having sexual intercourse.  Do not wear pantyhose. Knee-high and thigh-high hose are okay.  Avoid scrubbing your vulva with a washcloth. Wash the area gently and pat dry with a towel. SEEK MEDICAL CARE IF:   You develop vaginal bleeding after sexual intercourse.  You develop a lump at the opening of your vagina, even if it is not painful.  You have abnormal vaginal discharge.  You have vaginal dryness.  You have itching or irritation of the vulva or vagina.  You   develop a rash or reaction to your medicine. SEEK IMMEDIATE MEDICAL CARE IF:   You develop severe abdominal pain during or shortly after sexual intercourse. You could have a ruptured ovarian cyst or ruptured tubal pregnancy.  You have a fever.  You have painful or bloody urination.  You have painful sexual intercourse, and you never had it before.  You pass out after having  sexual intercourse. Document Released: 10/27/2007 Document Revised: 12/30/2011 Document Reviewed: 01/07/2011 Exit[employer] Patient Information 2014 [employer].  
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
## 2013-04-30 ENCOUNTER — Telehealth: Payer: Self-pay | Admitting: Obstetrics & Gynecology

## 2013-05-02 NOTE — Telephone Encounter (Signed)
Patient needs to make appointment 

## 2013-05-06 ENCOUNTER — Ambulatory Visit (INDEPENDENT_AMBULATORY_CARE_PROVIDER_SITE_OTHER): Payer: Medicaid Other | Admitting: Obstetrics & Gynecology

## 2013-05-06 ENCOUNTER — Encounter: Payer: Self-pay | Admitting: Obstetrics & Gynecology

## 2013-05-06 VITALS — BP 80/60 | Ht 63.0 in | Wt 207.0 lb

## 2013-05-06 DIAGNOSIS — IMO0002 Reserved for concepts with insufficient information to code with codable children: Secondary | ICD-10-CM

## 2013-05-06 DIAGNOSIS — N949 Unspecified condition associated with female genital organs and menstrual cycle: Secondary | ICD-10-CM

## 2013-05-06 DIAGNOSIS — N809 Endometriosis, unspecified: Secondary | ICD-10-CM

## 2013-05-06 LAB — POCT HEMOGLOBIN: Hemoglobin: 16.8 g/dL — AB (ref 12.2–16.2)

## 2013-05-06 NOTE — Patient Instructions (Signed)
Hysterectomy °Care After °These instructions give you information on caring for yourself after your procedure. Your doctor may also give you more specific instructions. Call your doctor if you have any problems or questions after your procedure. °HOME CARE °Healing takes time. You may have discomfort, tenderness, puffiness (swelling), and bruising at the wound site. This may last for 2 weeks. This is normal and will get better. °· Only take medicine as told by your doctor. °· Do not take aspirin. °· Do not drive when taking pain medicine. °· Exercise, lift objects, drive, and get back to daily activites as told by your doctor. °· Get back to your normal diet and activities as told by your doctor. °· Get plenty of rest and sleep. °· Do not douche, use tampons, or have sex (intercourse) for at least 6 weeks or as told. °· Change your bandages (dressings) as told by your doctor. °· Take your temperature during the day. °· Take showers for 2 to 3 weeks. Do not take baths. °· Do not drink alcohol until your doctor says it is okay. °· Take a medicine to help you poop (laxative) as told by your doctor. Try eating bran foods. Drink enough fluids to keep your pee (urine) clear or pale yellow. °· Have someone help you at home for 1 to 2 weeks after your surgery. °· Keep follow-up doctor visits as told. °GET HELP RIGHT AWAY IF:  °· You have a fever. °· You have bad belly (abdominal) pain. °· You have chest pain. °· You are short of breath. °· You pass out (faint). °· You have pain, puffiness, or redness of your leg. °· You bleed a lot from your vagina and notice clumps of tissue (clots). °· You have puffiness, redness, or pain where a tube was put in your vein (IV) or in the wound area. °· You have yellowish-white fluid (pus) coming from the wound. °· You have a bad smell coming from the wound or bandage. °· Your wound pulls apart. °· You feel dizzy or lightheaded. °· You have pain or bleeding when you pee. °· You keep having  watery poop (diarrhea). °· You keep feeling sick to your stomach (nauseous) or keep throwing up (vomiting). °· You have fluid (discharge) coming from your vagina. °· You have a rash. °· You have a reaction to your medicine. °· You need stronger pain medicine. °MAKE SURE YOU: °· Understand these instructions. °· Will watch your condition. °· Will get help right away if you are not doing well or get worse. °Document Released: 07/16/2008 Document Revised: 12/30/2011 Document Reviewed: 05/24/2011 °ExitCare® Patient Information ©2014 [employer]. ° °

## 2013-05-06 NOTE — Progress Notes (Signed)
Patient ID: Jaime Carter, female   DOB: October 05, 1976, 37 y.o.   MRN: [id number] Gerturde has failed conservative therapy and wants to proceed with [facility], she understands the need to possibly remove the left ovary in the future.  Scheduled for 05/26/2013 [facility]

## 2013-05-13 ENCOUNTER — Other Ambulatory Visit: Payer: Self-pay | Admitting: Obstetrics & Gynecology

## 2013-05-19 ENCOUNTER — Encounter (HOSPITAL_COMMUNITY): Payer: Self-pay | Admitting: Pharmacy Technician

## 2013-05-20 ENCOUNTER — Encounter (HOSPITAL_COMMUNITY): Payer: Self-pay

## 2013-05-20 ENCOUNTER — Encounter (HOSPITAL_COMMUNITY)
Admission: RE | Admit: 2013-05-20 | Discharge: 2013-05-20 | Disposition: A | Discharge status: Home / Self Care | Payer: Medicaid Other | Source: Ambulatory Visit | Attending: Obstetrics & Gynecology | Admitting: Obstetrics & Gynecology

## 2013-05-20 DIAGNOSIS — Z01812 Encounter for preprocedural laboratory examination: Secondary | ICD-10-CM | POA: Insufficient documentation

## 2013-05-20 DIAGNOSIS — Z01810 Encounter for preprocedural cardiovascular examination: Secondary | ICD-10-CM | POA: Insufficient documentation

## 2013-05-20 HISTORY — DX: Reserved for concepts with insufficient information to code with codable children: IMO0002

## 2013-05-20 LAB — COMPREHENSIVE METABOLIC PANEL
Albumin: 4.1 g/dL (ref 3.5–5.2)
Alkaline Phosphatase: 88 U/L (ref 39–117)
BUN: 11 mg/dL (ref 6–23)
Calcium: 10.3 mg/dL (ref 8.4–10.5)
Creatinine, Ser: 0.71 mg/dL (ref 0.50–1.10)
GFR calc Af Amer: >90 mL/min (ref >90)
Glucose, Bld: 497 mg/dL — ABNORMAL HIGH (ref 70–99)
Potassium: 4.5 mEq/L (ref 3.5–5.1)
Total Protein: 7.8 g/dL (ref 6.0–8.3)

## 2013-05-20 LAB — CBC
HCT: 44.5 % (ref 36.0–46.0)
MCH: 28.9 pg (ref 26.0–34.0)
MCHC: 34.8 g/dL (ref 30.0–36.0)
RDW: 13.5 % (ref 11.5–15.5)

## 2013-05-20 LAB — URINALYSIS, ROUTINE W REFLEX MICROSCOPIC
Bilirubin Urine: NEGATIVE
Ketones, ur: NEGATIVE mg/dL
Leukocytes, UA: NEGATIVE
Nitrite: NEGATIVE
Protein, ur: NEGATIVE mg/dL
Urobilinogen, UA: 0.2 mg/dL (ref 0.0–1.0)

## 2013-05-20 LAB — TYPE AND SCREEN
ABO/RH(D): A POS
Antibody Screen: NEGATIVE

## 2013-05-20 LAB — URINE MICROSCOPIC-ADD ON

## 2013-05-20 LAB — HCG, QUANTITATIVE, PREGNANCY: hCG, Beta Chain, Quant, S: 1 m[IU]/mL (ref <5)

## 2013-05-20 NOTE — Pre-Procedure Instructions (Signed)
Patient in for PAT at 1330. While doing medical history and going over patients medications with her she tells me she is a diabetic but there are no meds listed on her drug list for diabetes. She states she is suppose to be on Metformin bid as well as another medication for diabetes but that she cannot afford them. Asked her when the last time she took these was and she told me April. Dr Despina Hidden out of town but contacted Cyril Mourning, Nurse practitioner at his office and Hgb A1C was added to her labs per her order. She and i both counseled patient on importance of taking meds to control diabetes as well as the multiple medical problems hyperglycemia can cause. Told patient surgery may have to be canceled at this time because of this. Asked her about other symptoms andshe is having polyuria and polydipsia as well as some difficulty with blurred vision and "i just cant see well". Told her all of these symptoms could be from high glucose level. Asked patient when she last checked her glucose at home. She states 2 days ago and it was 370. I will contact Maurine Cane with results and she states she will contact patient and decided what course of action should be. At 1615 I checked glucose level and it was 497. This was called to Cyril Mourning who states she will contact patient

## 2013-05-20 NOTE — Patient Instructions (Addendum)
Jaime Carter  05/20/2013   Your procedure is scheduled on:  05/26/2013  Report to [facility] at  615 AM.  Call this number if you have problems the morning of surgery: [phone]   Remember:   Do not eat food or drink liquids after midnight.   Take these medicines the morning of surgery with A SIP OF WATER:xanax, flexaril, vicodin, synthroid   Do not wear jewelry, make-up or nail polish.  Do not wear lotions, powders, or perfumes.   Do not shave 48 hours prior to surgery. Men may shave face and neck.  Do not bring valuables to the hospital.  [facility] is not responsible for any belongings or valuables.  Contacts, dentures or bridgework may not be worn into surgery.  Leave suitcase in the car. After surgery it may be brought to your room.  For patients admitted to the hospital, checkout time is 11:00 AM the day of discharge.   Patients discharged the day of surgery will not be allowed to drive home.  Name and phone number of your driver: family  Special Instructions: Shower using CHG 2 nights before surgery and the night before surgery.  If you shower the day of surgery use CHG.  Use special wash - you have one bottle of CHG for all showers.  You should use approximately 1/3 of the bottle for each shower.   Please read over the following fact sheets that you were given: Pain Booklet, Coughing and Deep Breathing, Surgical Site Infection Prevention, Anesthesia Post-op Instructions and Care and Recovery After Surgery Unilateral Salpingo-Oophorectomy Unilateral salpingo-oophorectomy is the removal of one fallopian tube and ovary. The fallopian tubes transport the egg from the ovary to the womb (uterus). The fallopian tube is also where the sperm and egg meet and become fertilized and move down into the uterus.  Removing one tube and ovary will not:  Cause problems with your menstrual periods.  Cause problems with your sex drive (libido).  Put you into the menopause.  Give  symptoms of the menopause.  Make you not able to get pregnant (sterile). There are several reasons for doing a salpingo-oophorectomy:  Infection of the tube and ovary.  There may be scar tissue of the tube and ovary (adhesions).  It may be necessary to remove the tube when the ovary has a cyst or tumor.  It may have to be done when removing the uterus.  It may have to be done when there is cancer of the tube or ovary. LET YOUR CAREGIVER KNOW ABOUT:  Allergies to food or medications.  All the medications you are taking including prescription and over-the-counter herbs, eye drops and creams.  If you are using illegal drugs or excessive alcohol.  Your smoking habits.  Previous problems with anesthesia including numbing medication.  The possibility of being pregnant.  History of blood clots or bleeding problems.  Previous surgery.  Any other medical or health problems. RISKS AND COMPLICATIONS  All surgery is associated with risks. Some of these risks are:  Injury to surrounding organs.  Bleeding.  Infection.  Blood clots in the legs or lungs.  Problems with the anesthesia.  The surgery does not help the problem.  Death. BEFORE THE PROCEDURE  Do not take aspirin or blood thinners because it can make you bleed.  Do not eat or drink anything at least 8 hours before the surgery.  Let your caregiver know if you develop a cold or an  infection.  If you are being admitted the day of surgery, arrive at least one hour before the surgery.  Arrange for help when you go home from the hospital.  If you smoke, do not smoke for at least 2 weeks before the surgery. PROCEDURE After being admitted to the hospital, you will change into a hospital gown. Then, you will be given an IV (intravenous) and a medication to relax you. Then, you will be put to sleep with an anesthetic. Any hair on your lower belly (abdomen) will be removed, and a catheter will be placed in your bladder.  The fallopian tube and ovary will be removed either through 2 very small cuts (incisions) or through large incision in the lower abdomen. The blood vessels will be clamped and tied. AFTER THE PROCEDURE  You will be taken to the recovery room for 1 to 3 hours until your blood pressure, pulse and temperature are stable and you are waking up.  If you had a laparoscopy, you may be discharged in several hours.  If you had a large incision, you will be admitted to the hospital for a day or two.  If you had a laparoscopy, you may have shoulder pain. This is not unusual. It is from air that is left in the abdomen and affects the nerve that goes from the diaphragm to the shoulder. It goes away in a day or two.  You will be given pain medication as necessary.  The intravenous and catheter will be removed before you are discharged.  Have someone available to take you home. HOME CARE INSTRUCTIONS   It is normal to be sore for a week or two. Call your caregiver if the pain is getting worse or the pain medication is not helping.  Have help when you go home for a week or so to help with the household chores.  Follow your caregiver's advice regarding diet.  Get rest and sleep.  Only take over-the-counter or prescription medicines for pain or discomfort as directed by your caregiver.  Do not take aspirin. It can cause bleeding.  Do not drive, exercise or lift anything over 5 pounds.  Do not drink alcohol until your caregiver gives you permission.  Do not lift anything over 5 pounds.  Do not have sexual intercourse until your caregiver says it is OK.  Take your temperature twice a day and write it down.  Change the bandage (dressing) as directed.  Make and keep your follow-up appointments for postoperative care.  If you become constipated, ask your caregiver about taking a mild laxative. Drinking more liquids than usual and eating bran foods can help prevent constipation. SEEK MEDICAL CARE  IF:   You have swelling or redness around the cut (incision).  You develop a rash.  You have side effects from the medication.  You feel lightheaded.  You need more or stronger medication.  You have pain, swelling or redness where the IV (intravenous) was placed. SEEK IMMEDIATE MEDICAL CARE IF:   You develop an unexplained temperature above 100 F (37.8 C).  You develop increasing belly (abdominal) pain.  You have pus coming out of the incision.  You notice a bad smell coming from the wound or dressing.  The incision is separating.  There is excessive vaginal bleeding.  You start to feel sick to your stomach (nauseous) and vomit.  You have leg or chest pain.  You have pain when you urinate.  You develop shortness of breath.  You pass out.  Document Released: 08/04/2009 Document Revised: 12/30/2011 Document Reviewed: 08/04/2009 [facility] Patient Information 2014 [place], [place]. Hysterectomy Information A hysterectomy is a surgery to remove your uterus. After surgery, you will not have periods (menses) or be able to get pregnant.  REASONS FOR THIS SURGERY  You have bleeding that is not normal and keeps coming back.  You have lasting (chronic) lower belly (pelvic) pain.  You have a lasting infection.  The lining of your uterus grows outside your uterus.  Your uterus falls down into your vagina.  You have a growth in your uterus that causes problems.  You have cells that could turn into cancer (precancerous cells).  You have cancer of the uterus or cervix. TYPES  There are 3 types of hysterectomies. Depending on the type, the surgery will:  Remove the top part of the uterus only.  Remove the uterus and the cervix.  Remove the uterus, cervix, and tissue that holds the uterus in place in the lower belly. WAYS A HYSTERECTOMY CAN BE PERFORMED There are 5 ways this surgery can be performed.   A cut (incision) is made in the belly (abdomen). The uterus is  taken out through the cut.  A cut is made in the vagina. The uterus is taken out through the cut.  Three to four cuts are made in the belly. A surgical device is put through the cuts. The uterus is cut into small pieces by the surgical device. The uterus is taken out through the cuts or the vagina.  Three or four cuts are made in the belly. A surgical device is put through the cuts. The uterus is taken out through the vagina.  Three or four cuts are made in the belly. A surgical device that is controlled by a computer makes a visual image. The image helps the surgeon control the surgical device. The uterus is cut into small pieces. The pieces are taken out though the cuts or through the vagina. WHAT TO EXPECT AFTER THE SURGERY  You will be given pain medicine.  You will need help at home for 3 to 5 days after surgery.  You will need to see your doctor in 2 to 4 weeks after surgery.  You may get hot flashes, night sweats, and have trouble sleeping.  You may need to have Pap tests in the future if your surgery was related to cancer. Talk to your doctor. It is still good to have regular exams. Document Released: 12/30/2011 Document Reviewed: 12/30/2011 [facility] Patient Information 2014 [place], [place]. PATIENT INSTRUCTIONS POST-ANESTHESIA  IMMEDIATELY FOLLOWING SURGERY:  Do not drive or operate machinery for the first twenty four hours after surgery.  Do not make any important decisions for twenty four hours after surgery or while taking narcotic pain medications or sedatives.  If you develop intractable nausea and vomiting or a severe headache please notify your doctor immediately.  FOLLOW-UP:  Please make an appointment with your surgeon as instructed. You do not need to follow up with anesthesia unless specifically instructed to do so.  WOUND CARE INSTRUCTIONS (if applicable):  Keep a dry clean dressing on the anesthesia/puncture wound site if there is drainage.  Once the wound has quit  draining you may leave it open to air.  Generally you should leave the bandage intact for twenty four hours unless there is drainage.  If the epidural site drains for more than 36-48 hours please call the anesthesia department.  QUESTIONS?:  Please feel free to call your physician or the  hospital operator if you have any questions, and they will be happy to assist you.

## 2013-05-21 ENCOUNTER — Telehealth: Payer: Self-pay | Admitting: Adult Health

## 2013-05-21 NOTE — Pre-Procedure Instructions (Addendum)
Hemaglobin A1C and mean glucose called to [facility]. She will call patient and decide appropriate plan of action. Dr Jayme Cloud notified of all that has been going on with this patient. No further orders given.

## 2013-05-21 NOTE — Telephone Encounter (Signed)
Left message to call about her surgery

## 2013-05-21 NOTE — Telephone Encounter (Signed)
Pt called back and I told her that her A1c was 12 and that her blood sugar was 370 and that she needed to call her PCP and make appt to get back on her meds for diabetes and that her surgery for 8/6 was cancelled and that she needed to make an appt to follow up with Dr Despina Hidden.

## 2013-05-26 ENCOUNTER — Encounter (HOSPITAL_COMMUNITY): Admission: RE | Payer: Self-pay | Source: Ambulatory Visit

## 2013-05-26 ENCOUNTER — Ambulatory Visit (HOSPITAL_COMMUNITY)
Admission: RE | Admit: 2013-05-26 | Payer: Medicaid Other | Source: Ambulatory Visit | Admitting: Obstetrics & Gynecology

## 2013-05-26 SURGERY — HYSTERECTOMY, VAGINAL
Anesthesia: General | Laterality: Right

## 2013-06-08 ENCOUNTER — Encounter: Payer: Self-pay | Admitting: Obstetrics & Gynecology

## 2013-06-08 ENCOUNTER — Ambulatory Visit (INDEPENDENT_AMBULATORY_CARE_PROVIDER_SITE_OTHER): Payer: Medicaid Other | Admitting: Obstetrics & Gynecology

## 2013-06-08 VITALS — BP 100/70 | Ht 63.0 in | Wt 210.0 lb

## 2013-06-08 DIAGNOSIS — IMO0002 Reserved for concepts with insufficient information to code with codable children: Secondary | ICD-10-CM

## 2013-06-08 DIAGNOSIS — N809 Endometriosis, unspecified: Secondary | ICD-10-CM

## 2013-06-08 NOTE — Progress Notes (Signed)
Patient ID: Jaime Carter, female   DOB: Aug 05, 1976, 37 y.o.   MRN: [id number] Patient had to have her surgery cancelled due to noncompliance with her diabetes management Since that time she has seen the light regards to her diabetes control  We will reschedule her surgery hysteroscopy uterine curettage endometrial ablation for about a month from now In the meantime she will continue to improve her glucose management the medication diet and exercise

## 2013-06-28 ENCOUNTER — Other Ambulatory Visit: Payer: Self-pay | Admitting: *Deleted

## 2013-06-28 MED ORDER — MEGESTROL ACETATE 40 MG PO TABS: 40.0000 mg | ORAL_TABLET | Freq: Every day | ORAL | Status: DC

## 2013-07-02 ENCOUNTER — Ambulatory Visit (INDEPENDENT_AMBULATORY_CARE_PROVIDER_SITE_OTHER): Payer: Medicaid Other | Admitting: Obstetrics & Gynecology

## 2013-07-02 ENCOUNTER — Encounter: Payer: Self-pay | Admitting: Obstetrics & Gynecology

## 2013-07-02 VITALS — BP 88/58 | Ht 63.0 in | Wt 211.0 lb

## 2013-07-02 DIAGNOSIS — N949 Unspecified condition associated with female genital organs and menstrual cycle: Secondary | ICD-10-CM

## 2013-07-02 DIAGNOSIS — N809 Endometriosis, unspecified: Secondary | ICD-10-CM

## 2013-07-02 DIAGNOSIS — R102 Pelvic and perineal pain: Secondary | ICD-10-CM

## 2013-07-02 DIAGNOSIS — IMO0002 Reserved for concepts with insufficient information to code with codable children: Secondary | ICD-10-CM

## 2013-07-02 MED ORDER — METFORMIN HCL 500 MG PO TABS: 1000.0000 mg | ORAL_TABLET | Freq: Two times a day (BID) | ORAL | Status: DC
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
## 2013-07-05 ENCOUNTER — Encounter (HOSPITAL_COMMUNITY): Payer: Self-pay | Admitting: Pharmacy Technician

## 2013-07-08 ENCOUNTER — Encounter (HOSPITAL_COMMUNITY): Payer: Self-pay

## 2013-07-08 ENCOUNTER — Encounter (HOSPITAL_COMMUNITY)
Admission: RE | Admit: 2013-07-08 | Discharge: 2013-07-08 | Disposition: A | Discharge status: Home / Self Care | Payer: Medicaid Other | Source: Ambulatory Visit | Attending: Obstetrics & Gynecology | Admitting: Obstetrics & Gynecology

## 2013-07-08 DIAGNOSIS — Z01812 Encounter for preprocedural laboratory examination: Secondary | ICD-10-CM | POA: Insufficient documentation

## 2013-07-08 LAB — URINALYSIS, ROUTINE W REFLEX MICROSCOPIC
Bilirubin Urine: NEGATIVE
Glucose, UA: >1000 mg/dL — AB
Ketones, ur: 15 mg/dL — AB
Protein, ur: NEGATIVE mg/dL
pH: 5.5 (ref 5.0–8.0)

## 2013-07-08 LAB — COMPREHENSIVE METABOLIC PANEL
AST: 55 U/L — ABNORMAL HIGH (ref 0–37)
Albumin: 4 g/dL (ref 3.5–5.2)
Alkaline Phosphatase: 62 U/L (ref 39–117)
BUN: 16 mg/dL (ref 6–23)
CO2: 22 mEq/L (ref 19–32)
Chloride: 104 mEq/L (ref 96–112)
Creatinine, Ser: 0.6 mg/dL (ref 0.50–1.10)
GFR calc non Af Amer: >90 mL/min (ref >90)
Potassium: 4 mEq/L (ref 3.5–5.1)
Total Bilirubin: 0.3 mg/dL (ref 0.3–1.2)

## 2013-07-08 LAB — CBC
HCT: 42.1 % (ref 36.0–46.0)
MCV: 82.9 fL (ref 78.0–100.0)
Platelets: 191 10*3/uL (ref 150–400)
RBC: 5.08 MIL/uL (ref 3.87–5.11)
RDW: 13.7 % (ref 11.5–15.5)
WBC: 7.5 10*3/uL (ref 4.0–10.5)

## 2013-07-08 LAB — URINE MICROSCOPIC-ADD ON

## 2013-07-08 NOTE — Patient Instructions (Signed)
Jaime Carter  07/08/2013   Your procedure is scheduled on:  Wednesday, 07/14/13  Report to [facility] at 0700 AM.  Call this number if you have problems the morning of surgery: [phone]   Remember:   Do not eat food or drink liquids after midnight.   Take these medicines the morning of surgery with A SIP OF WATER: levothyroxine and  xanax. Please DO NOT take your medicine for diabetes.   Do not wear jewelry, make-up or nail polish.  Do not wear lotions, powders, or perfumes. You may wear deodorant.  Do not shave 48 hours prior to surgery. Men may shave face and neck.  Do not bring valuables to the hospital.  [facility] is not responsible                   for any belongings or valuables.  Contacts, dentures or bridgework may not be worn into surgery.  Leave suitcase in the car. After surgery it may be brought to your room.  For patients admitted to the hospital, checkout time is 11:00 AM the day of  discharge.   Patients discharged the day of surgery will not be allowed to drive  home.  Name and phone number of your driver: family  Special Instructions: Shower using CHG 2 nights before surgery and the night before surgery.  If you shower the day of surgery use CHG.  Use special wash - you have one bottle of CHG for all showers.  You should use approximately 1/3 of the bottle for each shower.   Please read over the following fact sheets that you were given: Surgical Site Infection Prevention, Anesthesia Post-op Instructions and Care and Recovery After Surgery Hysterectomy Information A hysterectomy is a surgery to remove your uterus. After surgery, you will not have periods (menses) or be able to get pregnant.  REASONS FOR THIS SURGERY  You have bleeding that is not normal and keeps coming back.  You have lasting (chronic) lower belly (pelvic) pain.  You have a lasting infection.  The lining of your uterus grows outside your uterus.  Your uterus falls down into your vagina.  You  have a growth in your uterus that causes problems.  You have cells that could turn into cancer (precancerous cells).  You have cancer of the uterus or cervix. TYPES  There are 3 types of hysterectomies. Depending on the type, the surgery will:  Remove the top part of the uterus only.  Remove the uterus and the cervix.  Remove the uterus, cervix, and tissue that holds the uterus in place in the lower belly. WAYS A HYSTERECTOMY CAN BE PERFORMED There are 5 ways this surgery can be performed.   A cut (incision) is made in the belly (abdomen). The uterus is taken out through the cut.  A cut is made in the vagina. The uterus is taken out through the cut.  Three to four cuts are made in the belly. A surgical device is put through the cuts. The uterus is cut into small pieces by the surgical device. The uterus is taken out through the cuts or the vagina.  Three or four cuts are made in the belly. A surgical device is put through the cuts. The uterus is taken out through the vagina.  Three or four cuts are made in the belly. A surgical device that is controlled by a computer makes a visual image. The image helps the surgeon control the surgical device. The uterus  is cut into small pieces. The pieces are taken out though the cuts or through the vagina. WHAT TO EXPECT AFTER THE SURGERY  You will be given pain medicine.  You will need help at home for 3 to 5 days after surgery.  You will need to see your doctor in 2 to 4 weeks after surgery.  You may get hot flashes, night sweats, and have trouble sleeping.  You may need to have Pap tests in the future if your surgery was related to cancer. Talk to your doctor. It is still good to have regular exams. Document Released: 12/30/2011 Document Reviewed: 12/30/2011 [facility] Patient Information 2014 [place], [place]. Hysterectomy Care After These instructions give you information on caring for yourself after your procedure. Your doctor may also  give you more specific instructions. Call your doctor if you have any problems or questions after your procedure. HOME CARE Healing takes time. You may have discomfort, tenderness, puffiness (swelling), and bruising at the wound site. This may last for 2 weeks. This is normal and will get better.  Only take medicine as told by your doctor.  Do not take aspirin.  Do not drive when taking pain medicine.  Exercise, lift objects, drive, and get back to daily activites as told by your doctor.  Get back to your normal diet and activities as told by your doctor.  Get plenty of rest and sleep.  Do not douche, use tampons, or have sex (intercourse) for at least 6 weeks or as told.  Change your bandages (dressings) as told by your doctor.  Take your temperature during the day.  Take showers for 2 to 3 weeks. Do not take baths.  Do not drink alcohol until your doctor says it is okay.  Take a medicine to help you poop (laxative) as told by your doctor. Try eating bran foods. Drink enough fluids to keep your pee (urine) clear or pale yellow.  Have someone help you at home for 1 to 2 weeks after your surgery.  Keep follow-up doctor visits as told. GET HELP RIGHT AWAY IF:   You have a fever.  You have bad belly (abdominal) pain.  You have chest pain.  You are short of breath.  You pass out (faint).  You have pain, puffiness, or redness of your leg.  You bleed a lot from your vagina and notice clumps of tissue (clots).  You have puffiness, redness, or pain where a tube was put in your vein (IV) or in the wound area.  You have yellowish-white fluid (pus) coming from the wound.  You have a bad smell coming from the wound or bandage.  Your wound pulls apart.  You feel dizzy or lightheaded.  You have pain or bleeding when you pee.  You keep having watery poop (diarrhea).  You keep feeling sick to your stomach (nauseous) or keep throwing up (vomiting).  You have fluid  (discharge) coming from your vagina.  You have a rash.  You have a reaction to your medicine.  You need stronger pain medicine. MAKE SURE YOU:  Understand these instructions.  Will watch your condition.  Will get help right away if you are not doing well or get worse. Document Released: 07/16/2008 Document Revised: 12/30/2011 Document Reviewed: 05/24/2011 [facility] Patient Information 2014 [place], [place].

## 2013-07-08 NOTE — Progress Notes (Signed)
Dr. Jayme Cloud notified of  H& H on preop labwork. No type and screen ordered, verified that type and screen was not needed.

## 2013-07-14 ENCOUNTER — Ambulatory Visit (HOSPITAL_COMMUNITY): Payer: Medicaid Other | Admitting: Anesthesiology

## 2013-07-14 ENCOUNTER — Encounter (HOSPITAL_COMMUNITY)
Admission: RE | Disposition: A | Discharge status: Home / Self Care | Payer: Self-pay | Source: Ambulatory Visit | Attending: Obstetrics & Gynecology

## 2013-07-14 ENCOUNTER — Encounter (HOSPITAL_COMMUNITY): Payer: Self-pay | Admitting: Anesthesiology

## 2013-07-14 ENCOUNTER — Encounter (HOSPITAL_COMMUNITY): Payer: Self-pay

## 2013-07-14 ENCOUNTER — Ambulatory Visit (HOSPITAL_COMMUNITY)
Admission: RE | Admit: 2013-07-14 | Discharge: 2013-07-15 | Disposition: A | Discharge status: Home / Self Care | Payer: Medicaid Other | Source: Ambulatory Visit | Attending: Obstetrics & Gynecology | Admitting: Obstetrics & Gynecology

## 2013-07-14 DIAGNOSIS — Z90710 Acquired absence of both cervix and uterus: Secondary | ICD-10-CM

## 2013-07-14 DIAGNOSIS — N8000 Endometriosis of the uterus, unspecified: Secondary | ICD-10-CM | POA: Insufficient documentation

## 2013-07-14 DIAGNOSIS — N946 Dysmenorrhea, unspecified: Secondary | ICD-10-CM | POA: Insufficient documentation

## 2013-07-14 DIAGNOSIS — Z23 Encounter for immunization: Secondary | ICD-10-CM | POA: Insufficient documentation

## 2013-07-14 DIAGNOSIS — N719 Inflammatory disease of uterus, unspecified: Secondary | ICD-10-CM

## 2013-07-14 DIAGNOSIS — IMO0002 Reserved for concepts with insufficient information to code with codable children: Secondary | ICD-10-CM | POA: Insufficient documentation

## 2013-07-14 DIAGNOSIS — N949 Unspecified condition associated with female genital organs and menstrual cycle: Secondary | ICD-10-CM

## 2013-07-14 DIAGNOSIS — Z01812 Encounter for preprocedural laboratory examination: Secondary | ICD-10-CM | POA: Insufficient documentation

## 2013-07-14 DIAGNOSIS — N8300 Follicular cyst of ovary, unspecified side: Secondary | ICD-10-CM

## 2013-07-14 HISTORY — PX: VAGINAL HYSTERECTOMY: SHX2639

## 2013-07-14 HISTORY — PX: SALPINGOOPHORECTOMY: SHX82

## 2013-07-14 LAB — GLUCOSE, CAPILLARY
Glucose-Capillary: 167 mg/dL — ABNORMAL HIGH (ref 70–99)
Glucose-Capillary: 201 mg/dL — ABNORMAL HIGH (ref 70–99)

## 2013-07-14 SURGERY — HYSTERECTOMY, VAGINAL
Anesthesia: General | Site: Vagina | Laterality: Right | Wound class: Clean Contaminated

## 2013-07-14 MED ORDER — CEFAZOLIN SODIUM-DEXTROSE 2-3 GM-% IV SOLR
2.0000 g | INTRAVENOUS | Status: AC
  Administered 2013-07-14: 2 g via INTRAVENOUS

## 2013-07-14 MED ORDER — LACTATED RINGERS IV SOLN
INTRAVENOUS | Status: DC
  Administered 2013-07-14 (×2): via INTRAVENOUS

## 2013-07-14 MED ORDER — STERILE WATER FOR IRRIGATION IR SOLN
Status: DC | PRN
  Administered 2013-07-14: 1000 mL

## 2013-07-14 MED ORDER — MENTHOL 3 MG MT LOZG: 1.0000 | LOZENGE | OROMUCOSAL | Status: DC | PRN

## 2013-07-14 MED ORDER — FENTANYL CITRATE 0.05 MG/ML IJ SOLN
INTRAMUSCULAR | Status: AC
  Filled 2013-07-14: qty 2

## 2013-07-14 MED ORDER — LEVOTHYROXINE SODIUM 75 MCG PO TABS
150.0000 ug | ORAL_TABLET | Freq: Every day | ORAL | Status: DC
  Administered 2013-07-15: 150 ug via ORAL
  Filled 2013-07-14 (×2): qty 2

## 2013-07-14 MED ORDER — ONDANSETRON 8 MG/NS 50 ML IVPB
8.0000 mg | Freq: Four times a day (QID) | INTRAVENOUS | Status: DC | PRN
  Filled 2013-07-14: qty 8

## 2013-07-14 MED ORDER — DOCUSATE SODIUM 100 MG PO CAPS
100.0000 mg | ORAL_CAPSULE | Freq: Two times a day (BID) | ORAL | Status: DC
  Administered 2013-07-14 – 2013-07-15 (×3): 100 mg via ORAL
  Filled 2013-07-14 (×3): qty 1

## 2013-07-14 MED ORDER — ROCURONIUM BROMIDE 50 MG/5ML IV SOLN
INTRAVENOUS | Status: AC
  Filled 2013-07-14: qty 1

## 2013-07-14 MED ORDER — FENTANYL CITRATE 0.05 MG/ML IJ SOLN
INTRAMUSCULAR | Status: AC
  Filled 2013-07-14: qty 5

## 2013-07-14 MED ORDER — FENTANYL CITRATE 0.05 MG/ML IJ SOLN
INTRAMUSCULAR | Status: DC | PRN
  Administered 2013-07-14: 50 ug via INTRAVENOUS
  Administered 2013-07-14: 100 ug via INTRAVENOUS
  Administered 2013-07-14 (×2): 50 ug via INTRAVENOUS

## 2013-07-14 MED ORDER — LIDOCAINE HCL (PF) 1 % IJ SOLN
INTRAMUSCULAR | Status: AC
  Filled 2013-07-14: qty 5

## 2013-07-14 MED ORDER — KCL IN DEXTROSE-NACL 40-5-0.45 MEQ/L-%-% IV SOLN
INTRAVENOUS | Status: DC
  Administered 2013-07-14 (×2): via INTRAVENOUS

## 2013-07-14 MED ORDER — ALPRAZOLAM 0.5 MG PO TABS: 0.5000 mg | ORAL_TABLET | Freq: Three times a day (TID) | ORAL | Status: DC | PRN

## 2013-07-14 MED ORDER — MIDAZOLAM HCL 2 MG/2ML IJ SOLN
INTRAMUSCULAR | Status: AC
  Filled 2013-07-14: qty 2

## 2013-07-14 MED ORDER — ALUM & MAG HYDROXIDE-SIMETH 200-200-20 MG/5ML PO SUSP: 30.0000 mL | ORAL | Status: DC | PRN

## 2013-07-14 MED ORDER — HYDROMORPHONE HCL PF 1 MG/ML IJ SOLN
1.0000 mg | INTRAMUSCULAR | Status: DC | PRN
  Administered 2013-07-14 (×3): 1 mg via INTRAVENOUS
  Administered 2013-07-15: 2 mg via INTRAVENOUS
  Administered 2013-07-15: 1 mg via INTRAVENOUS
  Filled 2013-07-14 (×4): qty 1
  Filled 2013-07-14: qty 2
  Filled 2013-07-14: qty 1

## 2013-07-14 MED ORDER — LIDOCAINE HCL 1 % IJ SOLN
INTRAMUSCULAR | Status: DC | PRN
  Administered 2013-07-14: 50 mg via INTRADERMAL

## 2013-07-14 MED ORDER — PROPOFOL 10 MG/ML IV BOLUS
INTRAVENOUS | Status: DC | PRN
  Administered 2013-07-14: 140 mg via INTRAVENOUS

## 2013-07-14 MED ORDER — SUCCINYLCHOLINE CHLORIDE 20 MG/ML IJ SOLN
INTRAMUSCULAR | Status: AC
  Filled 2013-07-14: qty 1

## 2013-07-14 MED ORDER — ZOLPIDEM TARTRATE 5 MG PO TABS: 10.0000 mg | ORAL_TABLET | Freq: Every evening | ORAL | Status: DC | PRN

## 2013-07-14 MED ORDER — GLYCOPYRROLATE 0.2 MG/ML IJ SOLN
INTRAMUSCULAR | Status: AC
  Filled 2013-07-14: qty 2

## 2013-07-14 MED ORDER — SUCCINYLCHOLINE CHLORIDE 20 MG/ML IJ SOLN
INTRAMUSCULAR | Status: DC | PRN
  Administered 2013-07-14: 140 mg via INTRAVENOUS

## 2013-07-14 MED ORDER — METFORMIN HCL 500 MG PO TABS
1000.0000 mg | ORAL_TABLET | Freq: Two times a day (BID) | ORAL | Status: DC
  Administered 2013-07-14 – 2013-07-15 (×2): 1000 mg via ORAL
  Filled 2013-07-14 (×2): qty 2

## 2013-07-14 MED ORDER — LINAGLIPTIN 5 MG PO TABS
5.0000 mg | ORAL_TABLET | Freq: Every day | ORAL | Status: DC
  Administered 2013-07-14 – 2013-07-15 (×2): 5 mg via ORAL
  Filled 2013-07-14 (×2): qty 1

## 2013-07-14 MED ORDER — ONDANSETRON HCL 4 MG/2ML IJ SOLN
4.0000 mg | Freq: Once | INTRAMUSCULAR | Status: AC
  Administered 2013-07-14: 4 mg via INTRAVENOUS

## 2013-07-14 MED ORDER — PNEUMOCOCCAL VAC POLYVALENT 25 MCG/0.5ML IJ INJ
0.5000 mL | INJECTION | INTRAMUSCULAR | Status: AC
  Administered 2013-07-15: 0.5 mL via INTRAMUSCULAR
  Filled 2013-07-14: qty 0.5

## 2013-07-14 MED ORDER — MIDAZOLAM HCL 5 MG/5ML IJ SOLN
INTRAMUSCULAR | Status: DC | PRN
  Administered 2013-07-14: 2 mg via INTRAVENOUS

## 2013-07-14 MED ORDER — ONDANSETRON HCL 4 MG/2ML IJ SOLN
INTRAMUSCULAR | Status: AC
  Filled 2013-07-14: qty 2

## 2013-07-14 MED ORDER — ROCURONIUM BROMIDE 100 MG/10ML IV SOLN
INTRAVENOUS | Status: DC | PRN
  Administered 2013-07-14: 30 mg via INTRAVENOUS
  Administered 2013-07-14: 10 mg via INTRAVENOUS

## 2013-07-14 MED ORDER — OXYCODONE-ACETAMINOPHEN 5-325 MG PO TABS
1.0000 | ORAL_TABLET | ORAL | Status: DC | PRN
  Administered 2013-07-15 (×2): 2 via ORAL
  Filled 2013-07-14 (×2): qty 2

## 2013-07-14 MED ORDER — ONDANSETRON HCL 4 MG/2ML IJ SOLN
4.0000 mg | Freq: Once | INTRAMUSCULAR | Status: AC | PRN
  Administered 2013-07-14: 4 mg via INTRAVENOUS

## 2013-07-14 MED ORDER — BUPIVACAINE-EPINEPHRINE PF 0.5-1:200000 % IJ SOLN
INTRAMUSCULAR | Status: AC
  Filled 2013-07-14: qty 10

## 2013-07-14 MED ORDER — NEOSTIGMINE METHYLSULFATE 1 MG/ML IJ SOLN
INTRAMUSCULAR | Status: DC | PRN
  Administered 2013-07-14 (×2): 1 mg via INTRAVENOUS

## 2013-07-14 MED ORDER — PROPOFOL 10 MG/ML IV EMUL
INTRAVENOUS | Status: AC
  Filled 2013-07-14: qty 20

## 2013-07-14 MED ORDER — CEFAZOLIN SODIUM 1-5 GM-% IV SOLN
INTRAVENOUS | Status: AC
  Filled 2013-07-14: qty 100

## 2013-07-14 MED ORDER — GLYCOPYRROLATE 0.2 MG/ML IJ SOLN
INTRAMUSCULAR | Status: DC | PRN
  Administered 2013-07-14: 0.4 mg via INTRAVENOUS

## 2013-07-14 MED ORDER — KETOROLAC TROMETHAMINE 30 MG/ML IJ SOLN
30.0000 mg | Freq: Once | INTRAMUSCULAR | Status: AC
  Administered 2013-07-14: 30 mg via INTRAVENOUS

## 2013-07-14 MED ORDER — KETOROLAC TROMETHAMINE 30 MG/ML IJ SOLN
INTRAMUSCULAR | Status: AC
  Filled 2013-07-14: qty 1

## 2013-07-14 MED ORDER — PROMETHAZINE HCL 25 MG/ML IJ SOLN
25.0000 mg | Freq: Once | INTRAMUSCULAR | Status: AC
  Administered 2013-07-14: 25 mg via INTRAVENOUS
  Filled 2013-07-14: qty 1

## 2013-07-14 MED ORDER — MIDAZOLAM HCL 2 MG/2ML IJ SOLN
1.0000 mg | INTRAMUSCULAR | Status: DC | PRN
  Administered 2013-07-14: 2 mg via INTRAVENOUS

## 2013-07-14 MED ORDER — ONDANSETRON HCL 4 MG PO TABS: 8.0000 mg | ORAL_TABLET | Freq: Four times a day (QID) | ORAL | Status: DC | PRN

## 2013-07-14 MED ORDER — FENTANYL CITRATE 0.05 MG/ML IJ SOLN
25.0000 ug | INTRAMUSCULAR | Status: DC | PRN
  Administered 2013-07-14 (×2): 50 ug via INTRAVENOUS

## 2013-07-14 MED ORDER — SODIUM CHLORIDE 0.9 % IR SOLN
Status: DC | PRN
  Administered 2013-07-14: 1000 mL

## 2013-07-14 MED ORDER — INFLUENZA VAC SPLIT QUAD 0.5 ML IM SUSP
0.5000 mL | INTRAMUSCULAR | Status: AC
  Administered 2013-07-15: 0.5 mL via INTRAMUSCULAR
  Filled 2013-07-14: qty 0.5

## 2013-07-14 MED ORDER — BUPIVACAINE-EPINEPHRINE 0.5% -1:200000 IJ SOLN
INTRAMUSCULAR | Status: DC | PRN
  Administered 2013-07-14: 19 mL

## 2013-07-14 SURGICAL SUPPLY — 65 items
ADH SKN CLS APL DERMABOND .7 (GAUZE/BANDAGES/DRESSINGS)
APPLIER CLIP 11 MED OPEN (CLIP)
APPLIER CLIP 13 LRG OPEN (CLIP) ×3
APR CLP LRG 13 20 CLIP (CLIP) ×2
APR CLP MED 11 20 MLT OPN (CLIP)
BAG HAMPER (MISCELLANEOUS) ×3 IMPLANT
BLADE SURG SZ10 CARB STEEL (BLADE) ×1 IMPLANT
BRR ADH 6X5 SEPRAFILM 1 SHT (MISCELLANEOUS)
CELLS DAT CNTRL 66122 CELL SVR (MISCELLANEOUS) IMPLANT
CLIP APPLIE 11 MED OPEN (CLIP) IMPLANT
CLIP APPLIE 13 LRG OPEN (CLIP) IMPLANT
CLOTH BEACON ORANGE TIMEOUT ST (SAFETY) ×3 IMPLANT
COVER LIGHT HANDLE STERIS (MISCELLANEOUS) ×6 IMPLANT
DECANTER SPIKE VIAL GLASS SM (MISCELLANEOUS) ×3 IMPLANT
DERMABOND ADVANCED (GAUZE/BANDAGES/DRESSINGS)
DERMABOND ADVANCED .7 DNX12 (GAUZE/BANDAGES/DRESSINGS) IMPLANT
DRAPE PROXIMA HALF (DRAPES) ×3 IMPLANT
DRAPE STERI URO 9X17 APER PCH (DRAPES) ×3 IMPLANT
DRAPE WARM FLUID 44X44 (DRAPE) ×3 IMPLANT
DRESSING TELFA 8X3 (GAUZE/BANDAGES/DRESSINGS) ×3 IMPLANT
ELECT REM PT RETURN 9FT ADLT (ELECTROSURGICAL) ×3
ELECTRODE REM PT RTRN 9FT ADLT (ELECTROSURGICAL) ×2 IMPLANT
FORMALIN 10 PREFIL 480ML (MISCELLANEOUS) ×3 IMPLANT
GAUZE PACKING 2X5 YD STERILE (GAUZE/BANDAGES/DRESSINGS) ×2 IMPLANT
GLOVE BIOGEL PI IND STRL 7.0 (GLOVE) IMPLANT
GLOVE BIOGEL PI IND STRL 8 (GLOVE) ×2 IMPLANT
GLOVE BIOGEL PI INDICATOR 7.0 (GLOVE) ×4
GLOVE BIOGEL PI INDICATOR 8 (GLOVE) ×1
GLOVE ECLIPSE 7.0 STRL STRAW (GLOVE) ×1 IMPLANT
GLOVE ECLIPSE 8.0 STRL XLNG CF (GLOVE) ×3 IMPLANT
GLOVE SS BIOGEL STRL SZ 6.5 (GLOVE) IMPLANT
GLOVE SUPERSENSE BIOGEL SZ 6.5 (GLOVE) ×1
GOWN STRL REIN XL XLG (GOWN DISPOSABLE) ×10 IMPLANT
INST SET MAJOR GENERAL (KITS) ×3 IMPLANT
IV NS IRRIG 3000ML ARTHROMATIC (IV SOLUTION) ×3 IMPLANT
KIT ROOM TURNOVER AP CYSTO (KITS) ×3 IMPLANT
KIT ROOM TURNOVER APOR (KITS) ×3 IMPLANT
MANIFOLD NEPTUNE II (INSTRUMENTS) ×3 IMPLANT
NEEDLE HYPO 22GX1.5 SAFETY (NEEDLE) ×3 IMPLANT
NS IRRIG 1000ML POUR BTL (IV SOLUTION) ×5 IMPLANT
PACK ABDOMINAL MAJOR (CUSTOM PROCEDURE TRAY) ×3 IMPLANT
PACK PERI GYN (CUSTOM PROCEDURE TRAY) ×3 IMPLANT
PAD ARMBOARD 7.5X6 YLW CONV (MISCELLANEOUS) ×3 IMPLANT
RETRACTOR WND ALEXIS 18 MED (MISCELLANEOUS) IMPLANT
RETRACTOR WND ALEXIS 25 LRG (MISCELLANEOUS) IMPLANT
RTRCTR WOUND ALEXIS 18CM MED (MISCELLANEOUS)
RTRCTR WOUND ALEXIS 25CM LRG (MISCELLANEOUS)
SEPRAFILM MEMBRANE 5X6 (MISCELLANEOUS) ×2 IMPLANT
SET BASIN LINEN APH (SET/KITS/TRAYS/PACK) ×3 IMPLANT
SET IV ADMIN VERSALIGHT (MISCELLANEOUS) ×3 IMPLANT
STAPLER VISISTAT 35W (STAPLE) ×2 IMPLANT
SUT CHROMIC 0 CT 1 (SUTURE) ×2 IMPLANT
SUT MNCRL+ AB 3-0 CT1 36 (SUTURE) ×2 IMPLANT
SUT MON AB 3-0 SH 27 (SUTURE) ×1 IMPLANT
SUT MONOCRYL AB 3-0 CT1 36IN (SUTURE)
SUT VIC AB 0 CT1 27 (SUTURE) ×9
SUT VIC AB 0 CT1 27XBRD ANTBC (SUTURE) ×2 IMPLANT
SUT VIC AB 0 CT1 27XCR 8 STRN (SUTURE) ×4 IMPLANT
SUT VIC AB 0 CTX 36 (SUTURE)
SUT VIC AB 0 CTX36XBRD ANTBCTR (SUTURE) ×2 IMPLANT
SUT VICRYL 3 0 (SUTURE) IMPLANT
SYR CONTROL 10ML LL (SYRINGE) ×3 IMPLANT
TRAY FOLEY CATH 16FR SILVER (SET/KITS/TRAYS/PACK) ×3 IMPLANT
VERSALIGHT (MISCELLANEOUS) ×3 IMPLANT
WATER STERILE IRR 1000ML POUR (IV SOLUTION) ×1 IMPLANT

## 2013-07-14 NOTE — Progress Notes (Signed)
UR Chart Review Completed  

## 2013-07-14 NOTE — Anesthesia Procedure Notes (Signed)
Procedure Name: Intubation Date/Time: 07/14/2013 8:48 AM Performed by: Despina Hidden Pre-anesthesia Checklist: Emergency Drugs available, Suction available, Patient being monitored and Patient identified Patient Re-evaluated:Patient Re-evaluated prior to inductionOxygen Delivery Method: Circle system utilized Preoxygenation: Pre-oxygenation with 100% oxygen Intubation Type: IV induction, Rapid sequence and Cricoid Pressure applied Ventilation: Mask ventilation without difficulty and Oral airway inserted - appropriate to patient size Laryngoscope Size: Mac and 3 Grade View: Grade I Tube type: Oral Tube size: 7.0 mm Number of attempts: 1 Airway Equipment and Method: Stylet Placement Confirmation: ETT inserted through vocal cords under direct vision,  positive ETCO2 and breath sounds checked- equal and bilateral Secured at: 20 cm Tube secured with: Tape Dental Injury: Teeth and Oropharynx as per pre-operative assessment

## 2013-07-14 NOTE — Transfer of Care (Signed)
Immediate Anesthesia Transfer of Care Note  Patient: Jaime Carter  Procedure(s) Performed: Procedure(s): HYSTERECTOMY VAGINAL (N/A) RIGHT SALPINGO OOPHORECTOMY (Right)  Patient Location: PACU  Anesthesia Type:General  Level of Consciousness: awake and patient cooperative  Airway & Oxygen Therapy: Patient Spontanous Breathing and Patient connected to face mask oxygen  Post-op Assessment: Report given to PACU RN, Post -op Vital signs reviewed and stable and Patient moving all extremities  Post vital signs: Reviewed and stable  Complications: No apparent anesthesia complications

## 2013-07-14 NOTE — Anesthesia Postprocedure Evaluation (Signed)
  Anesthesia Post-op Note  Patient: Jaime Carter  Procedure(s) Performed: Procedure(s): HYSTERECTOMY VAGINAL (N/A) RIGHT SALPINGO OOPHORECTOMY (Right)  Patient Location: PACU  Anesthesia Type:General  Level of Consciousness: awake, alert , oriented and patient cooperative  Airway and Oxygen Therapy: Patient Spontanous Breathing  Post-op Pain: 3 /10, mild  Post-op Assessment: Post-op Vital signs reviewed, Patient's Cardiovascular Status Stable, Respiratory Function Stable, Patent Airway and Pain level controlled  Post-op Vital Signs: Reviewed and stable  Complications: No apparent anesthesia complications

## 2013-07-14 NOTE — OR Nursing (Signed)
IV attempted by [facility] RN in right inner forearm blew and bruised, catheter immediatley removed and pressure dressing and warm compress applied to arm. Patient denies pain at present.

## 2013-07-14 NOTE — Anesthesia Preprocedure Evaluation (Signed)
Anesthesia Evaluation  Patient identified by MRN, date of birth, ID band Patient awake    Reviewed: Allergy & Precautions, H&P , NPO status , Patient's Chart, lab work & pertinent test results  Airway Mallampati: I TM Distance: >3 FB     Dental  (+) Edentulous Upper and Edentulous Lower   Pulmonary COPDformer smoker,  breath sounds clear to auscultation        Cardiovascular Rhythm:Regular Rate:Normal     Neuro/Psych PSYCHIATRIC DISORDERS Depression    GI/Hepatic GERD-  Medicated and Controlled,  Endo/Other  diabetes, Type 2, Oral Hypoglycemic AgentsHypothyroidism   Renal/GU      Musculoskeletal   Abdominal   Peds  Hematology   Anesthesia Other Findings   Reproductive/Obstetrics                           Anesthesia Physical Anesthesia Plan  ASA: III  Anesthesia Plan: General   Post-op Pain Management:    Induction: Intravenous, Rapid sequence and Cricoid pressure planned  Airway Management Planned: Oral ETT  Additional Equipment:   Intra-op Plan:   Post-operative Plan: Extubation in OR  Informed Consent: I have reviewed the patients History and Physical, chart, labs and discussed the procedure including the risks, benefits and alternatives for the proposed anesthesia with the patient or authorized representative who has indicated his/her understanding and acceptance.     Plan Discussed with:   Anesthesia Plan Comments:         Anesthesia Quick Evaluation

## 2013-07-14 NOTE — H&P (Signed)
Preoperative History and Physical  Jaime Carter is a 37 y.o. No obstetric history on file. with Patient's last menstrual period was 04/20/2013. admitted for a vaginal hysterectomy with removal of right tube and ovary due to chronic right pelvic pain, dyspareunia, dysmenorrhea.  PMH:  Past Medical History   Diagnosis  Date   .  Mixed hyperlipidemia    .  Hypothyroidism    .  Plantar fasciitis    .  GERD (gastroesophageal reflux disease)    .  Hepatic steatosis    .  Depression    .  Hyperlipidemia    .  Chest pain, atypical    .  Type 2 diabetes mellitus      not taking meds "i cant afford them".   .  Degenerative disc disease    PSH:  Past Surgical History   Procedure  Laterality  Date   .  Cholecystectomy     .  Tubal ligation     .  Bullet extraction from right neck     .  Wisdom tooth extraction     POb/GynH:  OB History    Grav  Para  Term  Preterm  Abortions  TAB  SAB  Ect  Mult  Living                 SH:  History   Substance Use Topics   .  Smoking status:  Former Smoker -- 2.00 packs/day for 30 years     Types:  Cigarettes     Quit date:  04/17/2011   .  Smokeless tobacco:  Never Used   .  Alcohol Use:  No   FH:  Family History   Problem  Relation  Age of Onset   .  Heart disease  Father      atrial fib   .  Heart disease  Mother      cardiac arrest   .  Coronary artery disease  Mother    .  Diabetes  Mother    .  Diabetes  Paternal Aunt    .  Diabetes  Paternal Uncle    .  Diabetes  Maternal Grandmother    Allergies: No Known Allergies  Medications: Current outpatient prescriptions:ALPRAZolam (XANAX) 0.5 MG tablet, Take 0.5 mg by mouth daily as needed for anxiety. , Disp: , Rfl: ; calcium carbonate (TUMS - DOSED IN MG ELEMENTAL CALCIUM) 500 MG chewable tablet, Chew 1 tablet by mouth 2 (two) times daily as needed for heartburn., Disp: , Rfl: ; cephALEXin (KEFLEX) 500 MG capsule, Take 500 mg by mouth 2 (two) times daily., Disp: , Rfl:  cyclobenzaprine  (FLEXERIL) 10 MG tablet, Take 1 tablet (10 mg total) by mouth 3 (three) times daily as needed for muscle spasms., Disp: 90 tablet, Rfl: 1; HYDROcodone-acetaminophen (VICODIN) 5-500 MG per tablet, Take 1 tablet by mouth every 6 (six) hours as needed for pain. , Disp: , Rfl: ; ibuprofen (ADVIL,MOTRIN) 200 MG tablet, Take 400 mg by mouth daily as needed for pain., Disp: , Rfl:  levothyroxine (SYNTHROID, LEVOTHROID) 100 MCG tablet, Take 100 mcg by mouth daily before breakfast., Disp: , Rfl: ; megestrol (MEGACE) 40 MG tablet, Take 1 tablet (40 mg total) by mouth daily., Disp: 30 tablet, Rfl: 3; metFORMIN (GLUCOPHAGE) 500 MG tablet, Take 2 tablets (1,000 mg total) by mouth 2 (two) times daily with a meal., Disp: 60 tablet, Rfl: 3; saxagliptin HCl (ONGLYZA) 2.5 MG TABS tablet, Take 5 mg by mouth  daily., Disp: , Rfl:  Review of Systems:  Review of Systems  Constitutional: Negative for fever, chills, weight loss, malaise/fatigue and diaphoresis.  HENT: Negative for hearing loss, ear pain, nosebleeds, congestion, sore throat, neck pain, tinnitus and ear discharge.  Eyes: Negative for blurred vision, double vision, photophobia, pain, discharge and redness.  Respiratory: Negative for cough, hemoptysis, sputum production, shortness of breath, wheezing and stridor.  Cardiovascular: Negative for chest pain, palpitations, orthopnea, claudication, leg swelling and PND.  Gastrointestinal: Positive for abdominal pain. Negative for heartburn, nausea, vomiting, diarrhea, constipation, blood in stool and melena.  Genitourinary: Negative for dysuria, urgency, frequency, hematuria and flank pain.  Musculoskeletal: Negative for myalgias, back pain, joint pain and falls.  Skin: Negative for itching and rash.  Neurological: Negative for dizziness, tingling, tremors, sensory change, speech change, focal weakness, seizures, loss of consciousness, weakness and headaches.  Endo/Heme/Allergies: Negative for environmental allergies  and polydipsia. Does not bruise/bleed easily.  Psychiatric/Behavioral: Negative for depression, suicidal ideas, hallucinations, memory loss and substance abuse. The patient is not nervous/anxious and does not have insomnia.  PHYSICAL EXAM:  Blood pressure 88/58, height 5\' 3"  (1.6 m), weight 211 lb (95.709 kg), last menstrual period 04/20/2013.  Vitals reviewed.  Constitutional: She is oriented to person, place, and time. She appears well-developed and well-nourished.  HENT:  Head: Normocephalic and atraumatic.  Right Ear: External ear normal.  Left Ear: External ear normal.  Nose: Nose normal.  Mouth/Throat: Oropharynx is clear and moist.  Eyes: Conjunctivae and EOM are normal. Pupils are equal, round, and reactive to light. Right eye exhibits no discharge. Left eye exhibits no discharge. No scleral icterus.  Neck: Normal range of motion. Neck supple. No tracheal deviation present. No thyromegaly present.  Cardiovascular: Normal rate, regular rhythm, normal heart sounds and intact distal pulses. Exam reveals no gallop and no friction rub.  No murmur heard.  Respiratory: Effort normal and breath sounds normal. No respiratory distress. She has no wheezes. She has no rales. She exhibits no tenderness.  GI: Soft. Bowel sounds are normal. She exhibits no distension and no mass. There is tenderness. There is no rebound and no guarding.  Genitourinary:  Vulva is normal without lesions Vagina is pink moist without discharge Cervix normal in appearance and pap is normal Uterus is normal size shape contour Adnexa is negative with normal sized ovaries by sonogram  Musculoskeletal: Normal range of motion. She exhibits no edema and no tenderness.  Neurological: She is alert and oriented to person, place, and time. She has normal reflexes. She displays normal reflexes. No cranial nerve deficit. She exhibits normal muscle tone. Coordination normal.  Skin: Skin is warm and dry. No rash noted. No erythema.  No pallor.  Psychiatric: She has a normal mood and affect. Her behavior is normal. Judgment and thought content normal.  Labs:  Results for orders placed during the hospital encounter of 07/14/13 (from the past 336 hour(s))  GLUCOSE, CAPILLARY   Collection Time    07/14/13  7:44 AM      Result Value Range   Glucose-Capillary 167 (*) 70 - 99 mg/dL   Comment 1 Notify RN    Results for orders placed during the hospital encounter of 07/08/13 (from the past 336 hour(s))  CBC   Collection Time    07/08/13 10:14 AM      Result Value Range   WBC 7.5  4.0 - 10.5 K/uL   RBC 5.08  3.87 - 5.11 MIL/uL   Hemoglobin 14.4  12.0 -  15.0 g/dL   HCT 04.5  40.9 - 81.1 %   MCV 82.9  78.0 - 100.0 fL   MCH 28.3  26.0 - 34.0 pg   MCHC 34.2  30.0 - 36.0 g/dL   RDW 91.4  78.2 - 95.6 %   Platelets 191  150 - 400 K/uL  COMPREHENSIVE METABOLIC PANEL   Collection Time    07/08/13 10:14 AM      Result Value Range   Sodium 138  135 - 145 mEq/L   Potassium 4.0  3.5 - 5.1 mEq/L   Chloride 104  96 - 112 mEq/L   CO2 22  19 - 32 mEq/L   Glucose, Bld 202 (*) 70 - 99 mg/dL   BUN 16  6 - 23 mg/dL   Creatinine, Ser 2.13  0.50 - 1.10 mg/dL   Calcium 9.9  8.4 - 08.6 mg/dL   Total Protein 7.3  6.0 - 8.3 g/dL   Albumin 4.0  3.5 - 5.2 g/dL   AST 55 (*) 0 - 37 U/L   ALT 73 (*) 0 - 35 U/L   Alkaline Phosphatase 62  39 - 117 U/L   Total Bilirubin 0.3  0.3 - 1.2 mg/dL   GFR calc non Af Amer >90  >90 mL/min   GFR calc Af Amer >90  >90 mL/min  HCG, QUANTITATIVE, PREGNANCY   Collection Time    07/08/13 10:16 AM      Result Value Range   hCG, Beta Chain, Quant, S <1  <5 mIU/mL  URINALYSIS, ROUTINE W REFLEX MICROSCOPIC   Collection Time    07/08/13 10:18 AM      Result Value Range   Color, Urine YELLOW  YELLOW   APPearance CLEAR  CLEAR   Specific Gravity, Urine 1.025  1.005 - 1.030   pH 5.5  5.0 - 8.0   Glucose, UA >1000 (*) NEGATIVE mg/dL   Hgb urine dipstick LARGE (*) NEGATIVE   Bilirubin Urine NEGATIVE   NEGATIVE   Ketones, ur 15 (*) NEGATIVE mg/dL   Protein, ur NEGATIVE  NEGATIVE mg/dL   Urobilinogen, UA 0.2  0.0 - 1.0 mg/dL   Nitrite NEGATIVE  NEGATIVE   Leukocytes, UA NEGATIVE  NEGATIVE  URINE MICROSCOPIC-ADD ON   Collection Time    07/08/13 10:18 AM      Result Value Range   Squamous Epithelial / LPF FEW (*) RARE   RBC / HPF TOO NUMEROUS TO COUNT  <3 RBC/hpf   Bacteria, UA RARE  RARE    EKG:  No orders found for this or any previous visit.  Imaging Studies:  No results found.  Assessment:  Patient Active Problem List    Diagnosis  Date Noted   .  Dyspareunia  04/09/2013   .  Pelvic pain  02/25/2013   .  Endometriosis  02/25/2013   Plan:  TVH RSO   Pt understands the risks of surgery including but not limited t  excessive bleeding requiring transfusion or reoperation, post-operative infection requiring prolonged hospitalization or re-hospitalization and antibiotic therapy, and damage to other organs including bladder, bowel, ureters and major vessels.  The patient also understands the alternative treatment options which were discussed in full.  All questions were answered.  EURE,LUTHER H 07/14/2013 8:16 AM

## 2013-07-14 NOTE — Op Note (Signed)
Preoperative diagnosis:  1.  Dysmenorrhea                                         2.  Dyspareunia                                         3.  Right lower quadrant pain                                         4.  adenomyosis  Postoperative diagnosis:  Same as above   Procedure:  Vaginal hysterectomy with removal of right tube and ovary  Surgeon:  Lazaro Arms MD  Anesthesia:  General Endotracheal  Findings:  Normal uterus probable adenomyosis, both ovaries were normal, no itraperitoneal abnormalities noted   Description of operation:  The patient was taken from the preoperative area to the operating room in stable condition. She was placed in the sitting position and underwent a spinal anesthetic. Once an adequate level of anesthesia was attained she was placed in the dorsal lithotomy position. Patient was prepped and draped in the usual sterile fashion and a Foley catheter was placed.  A weighted speculum was placed and the cervix was grasped with thyroid tenaculums both anteriorly and posteriorly.  0.5% Marcaine plain was injected in a circumferential fashion about the cervix and the electrocautery unit was used to incise the vagina and push at all cervix.  The posterior cul-de-sac was then entered sharply without difficulty.  The uterosacral ligaments were clamped cut and inspection suture ligated and held.  The cardinal ligaments were then clamped cut transfixion suture ligated and cut. The anterior peritoneum was identified the anterior cul-de-sac was entered sharply without difficulty. The anterior and posterior leaves of the broad ligament were plicated and the uterine vessels were clamped cut and suture ligated. Serial pedicles were taken of the fundus with each pedicle being clamped cut and suture ligated. The utero-ovarian ligaments were crossclamped the uterus was removed and both pedicles were transfixion suture ligated.  Morcellation of the uterus was performed to facilitate  visualization.  The right infundibulo pelvic ligament was cross clamped and the right ovary and tube were removed.  Hemoclips were used bilaterally as a backstop for both pedicles.  There was good hemostasis of all the pedicles. The peritoneum was then closed in a pursestring fashion using 3-0 Vicryl. The anterior posterior vagina was closed in interrupted fashion with good resultant hemostasis. After closure the lower pelvis and vagina were irrigated vigorously.  The sponge needle and instrument counts were correct x 3.  Total blood loss for the procedure was 150 cc.  The patient received 2 g of Ancef and 30 mg of Toradol IV preoperatively prophylactically.  She was taken to the recovery room in good stable condition awake alert doing well.  EURE,LUTHER H 07/14/2013, 10:35 AM

## 2013-07-15 ENCOUNTER — Encounter (HOSPITAL_COMMUNITY): Payer: Self-pay | Admitting: Obstetrics & Gynecology

## 2013-07-15 LAB — BASIC METABOLIC PANEL
BUN: 8 mg/dL (ref 6–23)
Calcium: 9.1 mg/dL (ref 8.4–10.5)
Chloride: 103 mEq/L (ref 96–112)
Creatinine, Ser: 0.66 mg/dL (ref 0.50–1.10)
GFR calc Af Amer: >90 mL/min (ref >90)
GFR calc non Af Amer: >90 mL/min (ref >90)
Potassium: 3.7 mEq/L (ref 3.5–5.1)

## 2013-07-15 LAB — CBC
HCT: 40.4 % (ref 36.0–46.0)
MCHC: 33.2 g/dL (ref 30.0–36.0)
MCV: 85.4 fL (ref 78.0–100.0)
Platelets: 167 10*3/uL (ref 150–400)
RBC: 4.73 MIL/uL (ref 3.87–5.11)
RDW: 13.9 % (ref 11.5–15.5)
WBC: 7.3 10*3/uL (ref 4.0–10.5)

## 2013-07-15 MED ORDER — CIPROFLOXACIN HCL 500 MG PO TABS: 500.0000 mg | ORAL_TABLET | Freq: Two times a day (BID) | ORAL | Status: DC

## 2013-07-15 MED ORDER — ONDANSETRON HCL 8 MG PO TABS: 8.0000 mg | ORAL_TABLET | Freq: Four times a day (QID) | ORAL | Status: DC | PRN

## 2013-07-15 MED ORDER — KCL IN DEXTROSE-NACL 40-5-0.45 MEQ/L-%-% IV SOLN: INTRAVENOUS | Status: DC

## 2013-07-15 MED ORDER — LEVOFLOXACIN IN D5W 750 MG/150ML IV SOLN
750.0000 mg | Freq: Once | INTRAVENOUS | Status: AC
  Administered 2013-07-15: 750 mg via INTRAVENOUS
  Filled 2013-07-15: qty 150

## 2013-07-15 MED ORDER — OXYCODONE-ACETAMINOPHEN 7.5-325 MG PO TABS: 1.0000 | ORAL_TABLET | Freq: Four times a day (QID) | ORAL | Status: DC | PRN

## 2013-07-15 MED ORDER — KETOROLAC TROMETHAMINE 10 MG PO TABS: 10.0000 mg | ORAL_TABLET | Freq: Three times a day (TID) | ORAL | Status: DC | PRN

## 2013-07-15 NOTE — Progress Notes (Signed)
Pt discharged home today per Dr. Despina Hidden. Pt's IV site D/C'd and WNL. Pt's VS stable at this time. Pt provided with home medication list, discharge instructions and prescriptions. Verbalized understanding. Pt left floor via WC in stable condition accompanied by NT.

## 2013-07-15 NOTE — Anesthesia Postprocedure Evaluation (Signed)
  Anesthesia Post-op Note  Patient: Jaime Carter  Procedure(s) Performed: Procedure(s): HYSTERECTOMY VAGINAL (N/A) RIGHT SALPINGO OOPHORECTOMY (Right)  Patient Location: Nursing Unit  Anesthesia Type:General  Level of Consciousness: awake  Airway and Oxygen Therapy: Patient Spontanous Breathing  Post-op Pain: mild  Post-op Assessment: Post-op Vital signs reviewed, Patient's Cardiovascular Status Stable, Respiratory Function Stable, Patent Airway and No signs of Nausea or vomiting  Post-op Vital Signs: Reviewed and stable  Complications: No apparent anesthesia complications

## 2013-07-15 NOTE — Discharge Summary (Signed)
Physician Discharge Summary  Patient ID: Jaime Carter MRN: [id number] DOB/AGE: Nov 27, 1975 37 y.o.  Admit date: 07/14/2013 Discharge date: 07/15/2013  Admission Diagnoses: Patient Active Problem List   Diagnosis Date Noted  . Dyspareunia 04/09/2013  . Pelvic pain 02/25/2013  . Endometriosis 02/25/2013     Discharge Diagnoses:  Stable post op  Discharged Condition: good  Hospital Course: unremarkable  Consults: None  Significant Diagnostic Studies: none  Treatments: surgery: tvh rso  Discharge Exam: Blood pressure 108/73, pulse 82, temperature 97.8 F (36.6 C), temperature source Oral, resp. rate 16, height 5\' 3"  (1.6 m), weight 213 lb (96.616 kg), last menstrual period 04/20/2013, SpO2 93.00%. General appearance: alert, cooperative and no distress GI: soft, non-tender; bowel sounds normal; no masses,  no organomegaly  Disposition: 01-Home or Self Care  Discharge Orders   Future Appointments Provider Department Dept Phone   07/26/2013 10:15 AM Lazaro Arms, MD [facility] [phone]   Future Orders Complete By Expires   Call MD for:  persistant nausea and vomiting  As directed    Call MD for:  severe uncontrolled pain  As directed    Call MD for:  temperature >100.4  As directed    Diet - low sodium heart healthy  As directed    Discharge instructions  As directed    Comments:     No driving, no lifting greater than 8 pounds and nothing in the vagina   Increase activity slowly  As directed    No wound care  As directed        Medication List    STOP taking these medications       HYDROcodone-acetaminophen 5-500 MG per tablet  Commonly known as:  VICODIN     ibuprofen 200 MG tablet  Commonly known as:  ADVIL,MOTRIN     megestrol 40 MG tablet  Commonly known as:  MEGACE      TAKE these medications       ALPRAZolam 0.5 MG tablet  Commonly known as:  XANAX  Take 0.5 mg by mouth daily as needed for anxiety.     cephALEXin 500 MG capsule   Commonly known as:  KEFLEX  Take 500 mg by mouth 2 (two) times daily.     ciprofloxacin 500 MG tablet  Commonly known as:  CIPRO  Take 1 tablet (500 mg total) by mouth 2 (two) times daily.     ketorolac 10 MG tablet  Commonly known as:  TORADOL  Take 1 tablet (10 mg total) by mouth every 8 (eight) hours as needed for pain.     levothyroxine 150 MCG tablet  Commonly known as:  SYNTHROID, LEVOTHROID  Take 150 mcg by mouth daily before breakfast.     metFORMIN 500 MG tablet  Commonly known as:  GLUCOPHAGE  Take 1,000 mg by mouth 2 (two) times daily with a meal.     ondansetron 8 MG tablet  Commonly known as:  ZOFRAN  Take 1 tablet (8 mg total) by mouth every 6 (six) hours as needed for nausea.     ONGLYZA 5 MG Tabs tablet  Generic drug:  saxagliptin HCl  Take 5 mg by mouth daily.     oxyCODONE-acetaminophen 7.5-325 MG per tablet  Commonly known as:  PERCOCET  Take 1-2 tablets by mouth every 6 (six) hours as needed for pain.           Follow-up Information   Follow up with Lazaro Arms, MD. (already scheduled)  Specialties:  Obstetrics and Gynecology, Radiology   Contact information:   [address] [place] [address] [phone]       Signed: Lazaro Arms 07/15/2013, 10:37 AM

## 2013-07-26 ENCOUNTER — Ambulatory Visit (INDEPENDENT_AMBULATORY_CARE_PROVIDER_SITE_OTHER): Payer: Self-pay | Admitting: Obstetrics & Gynecology

## 2013-07-26 ENCOUNTER — Encounter: Payer: Self-pay | Admitting: Obstetrics & Gynecology

## 2013-07-26 VITALS — BP 110/60 | Wt 212.0 lb

## 2013-07-26 DIAGNOSIS — Z9889 Other specified postprocedural states: Secondary | ICD-10-CM

## 2013-07-26 NOTE — Progress Notes (Signed)
Patient ID: Jaime Carter, female   DOB: 11-08-1975, 37 y.o.   MRN: [id number] POD#12 TVH RSO No complaints No bleeding No problems voiding Minimal pain meds use, none now  Exam sututes in place No masses or tenderness on bimanual  Warned about all activities at this point  Follow up in 4 weeks

## 2013-08-02 ENCOUNTER — Emergency Department (HOSPITAL_COMMUNITY)
Admission: EM | Admit: 2013-08-02 | Discharge: 2013-08-02 | Disposition: A | Discharge status: Home / Self Care | Payer: Medicaid Other | Attending: Emergency Medicine | Admitting: Emergency Medicine

## 2013-08-02 ENCOUNTER — Emergency Department (HOSPITAL_COMMUNITY): Payer: Medicaid Other

## 2013-08-02 ENCOUNTER — Encounter (HOSPITAL_COMMUNITY): Payer: Self-pay | Admitting: Emergency Medicine

## 2013-08-02 DIAGNOSIS — M79609 Pain in unspecified limb: Secondary | ICD-10-CM | POA: Insufficient documentation

## 2013-08-02 DIAGNOSIS — Z79899 Other long term (current) drug therapy: Secondary | ICD-10-CM | POA: Insufficient documentation

## 2013-08-02 DIAGNOSIS — F3289 Other specified depressive episodes: Secondary | ICD-10-CM | POA: Insufficient documentation

## 2013-08-02 DIAGNOSIS — M545 Low back pain, unspecified: Secondary | ICD-10-CM | POA: Insufficient documentation

## 2013-08-02 DIAGNOSIS — Z87891 Personal history of nicotine dependence: Secondary | ICD-10-CM | POA: Insufficient documentation

## 2013-08-02 DIAGNOSIS — G8929 Other chronic pain: Secondary | ICD-10-CM | POA: Insufficient documentation

## 2013-08-02 DIAGNOSIS — Z8742 Personal history of other diseases of the female genital tract: Secondary | ICD-10-CM | POA: Insufficient documentation

## 2013-08-02 DIAGNOSIS — Z8719 Personal history of other diseases of the digestive system: Secondary | ICD-10-CM | POA: Insufficient documentation

## 2013-08-02 DIAGNOSIS — Z9889 Other specified postprocedural states: Secondary | ICD-10-CM | POA: Insufficient documentation

## 2013-08-02 DIAGNOSIS — F329 Major depressive disorder, single episode, unspecified: Secondary | ICD-10-CM | POA: Insufficient documentation

## 2013-08-02 DIAGNOSIS — E119 Type 2 diabetes mellitus without complications: Secondary | ICD-10-CM | POA: Insufficient documentation

## 2013-08-02 DIAGNOSIS — E039 Hypothyroidism, unspecified: Secondary | ICD-10-CM | POA: Insufficient documentation

## 2013-08-02 HISTORY — DX: Other chronic pain: G89.29

## 2013-08-02 HISTORY — DX: Pelvic and perineal pain: R10.2

## 2013-08-02 MED ORDER — CYCLOBENZAPRINE HCL 10 MG PO TABS: 10.0000 mg | ORAL_TABLET | Freq: Two times a day (BID) | ORAL | Status: DC | PRN

## 2013-08-02 MED ORDER — CYCLOBENZAPRINE HCL 10 MG PO TABS
10.0000 mg | ORAL_TABLET | Freq: Once | ORAL | Status: AC
  Administered 2013-08-02: 10 mg via ORAL
  Filled 2013-08-02: qty 1

## 2013-08-02 MED ORDER — OXYCODONE-ACETAMINOPHEN 5-325 MG PO TABS
1.0000 | ORAL_TABLET | Freq: Once | ORAL | Status: AC
  Administered 2013-08-02: 1 via ORAL
  Filled 2013-08-02: qty 1

## 2013-08-02 MED ORDER — MELOXICAM 7.5 MG PO TABS: 7.5000 mg | ORAL_TABLET | Freq: Every day | ORAL | Status: DC
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
## 2013-08-02 NOTE — ED Notes (Signed)
Pt reports low back pain since Tuesday, put legs over arm of chair and pain began, hurts to walk

## 2013-08-02 NOTE — ED Notes (Signed)
Patient with no complaints at this time. Respirations even and unlabored. Skin warm/dry. Discharge instructions reviewed with patient at this time. Patient given opportunity to voice concerns/ask questions. Patient discharged at this time and left Emergency Department via wheelchair. 

## 2013-08-03 NOTE — ED Provider Notes (Signed)
Medical screening examination/treatment/procedure(s) were performed by non-physician practitioner and as supervising physician I was immediately available for consultation/collaboration.   Shala Baumbach M Eymi Lipuma, DO 08/03/13 0706 

## 2013-08-16 ENCOUNTER — Ambulatory Visit: Payer: Medicaid Other | Attending: *Deleted | Admitting: Physical Therapy

## 2013-08-16 DIAGNOSIS — R5381 Other malaise: Secondary | ICD-10-CM | POA: Insufficient documentation

## 2013-08-16 DIAGNOSIS — M545 Low back pain, unspecified: Secondary | ICD-10-CM | POA: Insufficient documentation

## 2013-08-16 DIAGNOSIS — IMO0001 Reserved for inherently not codable concepts without codable children: Secondary | ICD-10-CM | POA: Insufficient documentation

## 2013-08-23 ENCOUNTER — Encounter: Payer: Self-pay | Admitting: Obstetrics & Gynecology

## 2013-08-23 ENCOUNTER — Ambulatory Visit (INDEPENDENT_AMBULATORY_CARE_PROVIDER_SITE_OTHER): Payer: Self-pay | Admitting: Obstetrics & Gynecology

## 2013-08-23 VITALS — BP 100/80 | Wt 210.0 lb

## 2013-08-23 DIAGNOSIS — Z9889 Other specified postprocedural states: Secondary | ICD-10-CM

## 2013-08-23 MED ORDER — METFORMIN HCL 500 MG PO TABS: 1000.0000 mg | ORAL_TABLET | Freq: Two times a day (BID) | ORAL | Status: DC

## 2013-08-23 MED ORDER — METRONIDAZOLE 0.75 % VA GEL: VAGINAL | Status: DC

## 2013-08-23 NOTE — Addendum Note (Signed)
Addended by: Lazaro Arms on: 08/23/2013 10:34 AM   Modules accepted: Orders
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
## 2013-08-24 ENCOUNTER — Other Ambulatory Visit: Payer: Self-pay | Admitting: *Deleted

## 2013-08-24 MED ORDER — METRONIDAZOLE 500 MG PO TABS: 500.0000 mg | ORAL_TABLET | Freq: Two times a day (BID) | ORAL | Status: DC

## 2013-08-25 ENCOUNTER — Telehealth: Payer: Self-pay | Admitting: *Deleted

## 2013-08-25 NOTE — Telephone Encounter (Signed)
Pt informed Metronidazole e-scribed to [facility].

## 2013-09-20 ENCOUNTER — Ambulatory Visit (INDEPENDENT_AMBULATORY_CARE_PROVIDER_SITE_OTHER): Payer: Self-pay | Admitting: Obstetrics & Gynecology

## 2013-09-20 ENCOUNTER — Encounter (INDEPENDENT_AMBULATORY_CARE_PROVIDER_SITE_OTHER): Payer: Self-pay

## 2013-09-20 ENCOUNTER — Encounter: Payer: Self-pay | Admitting: Obstetrics & Gynecology

## 2013-09-20 VITALS — BP 100/70 | Wt 211.0 lb

## 2013-09-20 DIAGNOSIS — Z9889 Other specified postprocedural states: Secondary | ICD-10-CM

## 2013-09-20 NOTE — Progress Notes (Signed)
Patient ID: Jaime Carter, female   DOB: 03-25-1976, 37 y.o.   MRN: [id number] Post op TVHRSO on 07/14/2013 No complaints  Exam Sutures till present at vaginal cuff No discharge bimanual no masses or tenderness  No vaginal intercourse for 3 weeks  Follow up 1 year

## 2016-06-18 ENCOUNTER — Other Ambulatory Visit: Payer: Self-pay | Admitting: Physician Assistant

## 2016-06-19 ENCOUNTER — Other Ambulatory Visit: Payer: Self-pay | Admitting: *Deleted

## 2016-08-23 ENCOUNTER — Other Ambulatory Visit: Payer: Self-pay | Admitting: *Deleted

## 2016-10-15 ENCOUNTER — Other Ambulatory Visit: Payer: Self-pay | Admitting: Physician Assistant

## 2016-11-18 ENCOUNTER — Other Ambulatory Visit: Payer: Self-pay | Admitting: Physician Assistant

## 2016-12-23 ENCOUNTER — Other Ambulatory Visit: Payer: Self-pay | Admitting: Physician Assistant

## 2017-03-05 ENCOUNTER — Encounter (HOSPITAL_COMMUNITY): Payer: Self-pay | Admitting: Emergency Medicine

## 2017-03-05 ENCOUNTER — Emergency Department (HOSPITAL_COMMUNITY): Payer: Medicaid Other

## 2017-03-05 ENCOUNTER — Emergency Department (HOSPITAL_COMMUNITY)
Admission: EM | Admit: 2017-03-05 | Discharge: 2017-03-05 | Disposition: A | Discharge status: Home / Self Care | Payer: Medicaid Other | Attending: Emergency Medicine | Admitting: Emergency Medicine

## 2017-03-05 DIAGNOSIS — Y9389 Activity, other specified: Secondary | ICD-10-CM | POA: Diagnosis not present

## 2017-03-05 DIAGNOSIS — S0083XA Contusion of other part of head, initial encounter: Secondary | ICD-10-CM | POA: Insufficient documentation

## 2017-03-05 DIAGNOSIS — E119 Type 2 diabetes mellitus without complications: Secondary | ICD-10-CM | POA: Insufficient documentation

## 2017-03-05 DIAGNOSIS — Z87891 Personal history of nicotine dependence: Secondary | ICD-10-CM | POA: Insufficient documentation

## 2017-03-05 DIAGNOSIS — Y929 Unspecified place or not applicable: Secondary | ICD-10-CM | POA: Diagnosis not present

## 2017-03-05 DIAGNOSIS — Y999 Unspecified external cause status: Secondary | ICD-10-CM | POA: Diagnosis not present

## 2017-03-05 DIAGNOSIS — M47812 Spondylosis without myelopathy or radiculopathy, cervical region: Secondary | ICD-10-CM | POA: Diagnosis not present

## 2017-03-05 DIAGNOSIS — J324 Chronic pansinusitis: Secondary | ICD-10-CM | POA: Diagnosis not present

## 2017-03-05 DIAGNOSIS — Z7984 Long term (current) use of oral hypoglycemic drugs: Secondary | ICD-10-CM | POA: Insufficient documentation

## 2017-03-05 DIAGNOSIS — M50323 Other cervical disc degeneration at C6-C7 level: Secondary | ICD-10-CM | POA: Diagnosis not present

## 2017-03-05 DIAGNOSIS — S199XXA Unspecified injury of neck, initial encounter: Secondary | ICD-10-CM | POA: Insufficient documentation

## 2017-03-05 DIAGNOSIS — E039 Hypothyroidism, unspecified: Secondary | ICD-10-CM | POA: Diagnosis not present

## 2017-03-05 DIAGNOSIS — Z79899 Other long term (current) drug therapy: Secondary | ICD-10-CM | POA: Diagnosis not present

## 2017-03-05 DIAGNOSIS — M5030 Other cervical disc degeneration, unspecified cervical region: Secondary | ICD-10-CM

## 2017-03-05 DIAGNOSIS — S0990XA Unspecified injury of head, initial encounter: Secondary | ICD-10-CM | POA: Diagnosis present

## 2017-03-05 LAB — CBG MONITORING, ED: GLUCOSE-CAPILLARY: 167 mg/dL — AB (ref 65–99)

## 2017-03-05 MED ORDER — HYDROCODONE-ACETAMINOPHEN 5-325 MG PO TABS
1.0000 | ORAL_TABLET | Freq: Once | ORAL | Status: AC
  Administered 2017-03-05: 1 via ORAL
  Filled 2017-03-05: qty 1

## 2017-03-05 MED ORDER — HYDROCODONE-ACETAMINOPHEN 5-325 MG PO TABS: 1.0000 | ORAL_TABLET | ORAL | 0 refills | Status: DC | PRN

## 2017-03-05 NOTE — ED Notes (Signed)
Per MD Effie ShyWentz pt can take her personal 0.25mg  Xanax.
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
## 2017-03-05 NOTE — ED Notes (Signed)
Pt taken to CT at this time.
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
## 2017-05-27 ENCOUNTER — Emergency Department (HOSPITAL_COMMUNITY)
Admission: EM | Admit: 2017-05-27 | Discharge: 2017-05-27 | Disposition: A | Discharge status: Home / Self Care | Payer: Medicaid Other | Attending: Emergency Medicine | Admitting: Emergency Medicine

## 2017-05-27 ENCOUNTER — Encounter (HOSPITAL_COMMUNITY): Payer: Self-pay | Admitting: Emergency Medicine

## 2017-05-27 DIAGNOSIS — Z79899 Other long term (current) drug therapy: Secondary | ICD-10-CM | POA: Diagnosis not present

## 2017-05-27 DIAGNOSIS — R319 Hematuria, unspecified: Secondary | ICD-10-CM | POA: Insufficient documentation

## 2017-05-27 DIAGNOSIS — R0789 Other chest pain: Secondary | ICD-10-CM | POA: Insufficient documentation

## 2017-05-27 DIAGNOSIS — Z87891 Personal history of nicotine dependence: Secondary | ICD-10-CM | POA: Insufficient documentation

## 2017-05-27 DIAGNOSIS — N390 Urinary tract infection, site not specified: Secondary | ICD-10-CM

## 2017-05-27 DIAGNOSIS — Z7984 Long term (current) use of oral hypoglycemic drugs: Secondary | ICD-10-CM | POA: Diagnosis not present

## 2017-05-27 DIAGNOSIS — E119 Type 2 diabetes mellitus without complications: Secondary | ICD-10-CM | POA: Diagnosis not present

## 2017-05-27 DIAGNOSIS — E039 Hypothyroidism, unspecified: Secondary | ICD-10-CM | POA: Insufficient documentation

## 2017-05-27 DIAGNOSIS — R300 Dysuria: Secondary | ICD-10-CM | POA: Diagnosis present

## 2017-05-27 HISTORY — DX: Endometriosis, unspecified: N80.9

## 2017-05-27 LAB — URINALYSIS, ROUTINE W REFLEX MICROSCOPIC
BACTERIA UA: NONE SEEN
BILIRUBIN URINE: NEGATIVE
Glucose, UA: >=500 mg/dL — AB
Ketones, ur: 20 mg/dL — AB
Nitrite: NEGATIVE
Protein, ur: 100 mg/dL — AB
Specific Gravity, Urine: 1.027 (ref 1.005–1.030)
pH: 5 (ref 5.0–8.0)

## 2017-05-27 MED ORDER — CEPHALEXIN 500 MG PO CAPS
500.0000 mg | ORAL_CAPSULE | Freq: Once | ORAL | Status: AC
  Administered 2017-05-27: 500 mg via ORAL

## 2017-05-27 MED ORDER — CEPHALEXIN 500 MG PO CAPS: 500.0000 mg | ORAL_CAPSULE | Freq: Four times a day (QID) | ORAL | 0 refills | Status: DC

## 2017-05-27 MED ORDER — CEPHALEXIN 500 MG PO CAPS
500.0000 mg | ORAL_CAPSULE | Freq: Once | ORAL | Status: DC
  Filled 2017-05-27: qty 1

## 2017-05-27 MED ORDER — FLUCONAZOLE 150 MG PO TABS: 150.0000 mg | ORAL_TABLET | Freq: Every day | ORAL | 1 refills | Status: DC

## 2017-05-27 NOTE — ED Triage Notes (Signed)
Noticed blood when she wiped this am after voiding. Also states she had some chest pain,

## 2017-05-27 NOTE — ED Notes (Signed)
Pt alert & oriented x4, stable gait. Patient given discharge instructions, paperwork & prescription(s). Patient  instructed to stop at the registration desk to finish any additional paperwork. Patient verbalized understanding. Pt left department w/ no further questions. 

## 2017-05-27 NOTE — Discharge Instructions (Signed)
Return if any problems.  See your Physician for recheck in 1 week   °
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
## 2019-02-23 ENCOUNTER — Encounter: Payer: Self-pay | Admitting: Cardiology
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
## 2019-02-24 ENCOUNTER — Encounter: Payer: Self-pay | Admitting: Cardiology

## 2019-02-24 ENCOUNTER — Telehealth (INDEPENDENT_AMBULATORY_CARE_PROVIDER_SITE_OTHER): Payer: Self-pay | Admitting: Cardiology

## 2019-02-24 VITALS — Ht 63.0 in | Wt 185.0 lb

## 2019-02-24 DIAGNOSIS — Z79899 Other long term (current) drug therapy: Secondary | ICD-10-CM

## 2019-02-24 DIAGNOSIS — Z7189 Other specified counseling: Secondary | ICD-10-CM

## 2019-02-24 DIAGNOSIS — R072 Precordial pain: Secondary | ICD-10-CM | POA: Insufficient documentation

## 2019-02-24 DIAGNOSIS — E785 Hyperlipidemia, unspecified: Secondary | ICD-10-CM

## 2019-02-24 MED ORDER — NITROGLYCERIN 0.4 MG SL SUBL: 0.4000 mg | SUBLINGUAL_TABLET | SUBLINGUAL | 2 refills | Status: DC | PRN

## 2019-02-24 NOTE — Patient Instructions (Signed)
Medication Instructions:  START- Aspirin 81 mg daily  If you need a refill on your cardiac medications before your next appointment, please call your pharmacy.  Labwork: CBC, BMP, A1C and fasting Lipids HERE IN OUR OFFICE AT [facility]  You will need to fast. DO NOT EAT OR DRINK PAST MIDNIGHT.       Take the provided lab slips with you to the lab for your blood draw.   When you have your labs (blood work) drawn today and your tests are completely normal, you will receive your results only by MyChart Message (if you have MyChart) -OR-  A paper copy in the mail.  If you have any lab test that is abnormal or we need to change your treatment, we will call you to review these results.  Testing/Procedures: None Ordered   Follow-Up: . You will need a follow up appointment in Tomorrow  At [facility], you and your health needs are our priority.  As part of our continuing mission to provide you with exceptional heart care, we have created designated Provider Care Teams.  These Care Teams include your primary Cardiologist (physician) and Advanced Practice Providers (APPs -  Physician Assistants and Nurse Practitioners) who all work together to provide you with the care you need, when you need it.  Thank you for choosing [facility] at [facility]!!

## 2019-02-25 ENCOUNTER — Other Ambulatory Visit: Payer: Self-pay

## 2019-02-25 ENCOUNTER — Other Ambulatory Visit: Payer: Self-pay | Admitting: *Deleted

## 2019-02-25 ENCOUNTER — Ambulatory Visit: Payer: Medicaid Other | Admitting: *Deleted

## 2019-02-25 ENCOUNTER — Other Ambulatory Visit (INDEPENDENT_AMBULATORY_CARE_PROVIDER_SITE_OTHER): Payer: Self-pay | Admitting: *Deleted

## 2019-02-25 ENCOUNTER — Other Ambulatory Visit: Payer: Medicaid Other | Admitting: *Deleted

## 2019-02-25 DIAGNOSIS — E785 Hyperlipidemia, unspecified: Secondary | ICD-10-CM

## 2019-02-25 DIAGNOSIS — R072 Precordial pain: Secondary | ICD-10-CM

## 2019-02-25 DIAGNOSIS — I200 Unstable angina: Secondary | ICD-10-CM

## 2019-02-25 LAB — BASIC METABOLIC PANEL
BUN/Creatinine Ratio: 13 (ref 9–23)
BUN: 9 mg/dL (ref 6–24)
CO2: 23 mmol/L (ref 20–29)
Calcium: 9.4 mg/dL (ref 8.7–10.2)
Chloride: 101 mmol/L (ref 96–106)
Creatinine, Ser: 0.7 mg/dL (ref 0.57–1.00)
GFR calc Af Amer: 124 mL/min/{1.73_m2} (ref >59)
GFR calc non Af Amer: 107 mL/min/{1.73_m2} (ref >59)
Glucose: 179 mg/dL — ABNORMAL HIGH (ref 65–99)
Potassium: 4.6 mmol/L (ref 3.5–5.2)
Sodium: 140 mmol/L (ref 134–144)

## 2019-02-25 LAB — CBC WITH DIFFERENTIAL/PLATELET
Basophils Absolute: 0.1 10*3/uL (ref 0.0–0.2)
Basos: 1 %
EOS (ABSOLUTE): 0.3 10*3/uL (ref 0.0–0.4)
Eos: 4 %
Hematocrit: 46.9 % — ABNORMAL HIGH (ref 34.0–46.6)
Hemoglobin: 14.8 g/dL (ref 11.1–15.9)
Immature Grans (Abs): 0 10*3/uL (ref 0.0–0.1)
Immature Granulocytes: 0 %
Lymphocytes Absolute: 3.1 10*3/uL (ref 0.7–3.1)
Lymphs: 38 %
MCH: 27.5 pg (ref 26.6–33.0)
MCHC: 31.6 g/dL (ref 31.5–35.7)
MCV: 87 fL (ref 79–97)
Monocytes Absolute: 1 10*3/uL — ABNORMAL HIGH (ref 0.1–0.9)
Monocytes: 12 %
Neutrophils Absolute: 3.7 10*3/uL (ref 1.4–7.0)
Neutrophils: 45 %
Platelets: 221 10*3/uL (ref 150–450)
RBC: 5.39 x10E6/uL — ABNORMAL HIGH (ref 3.77–5.28)
RDW: 12.7 % (ref 11.7–15.4)
WBC: 8.2 10*3/uL (ref 3.4–10.8)

## 2019-02-25 LAB — LIPID PANEL
Chol/HDL Ratio: 5.2 ratio — ABNORMAL HIGH (ref 0.0–4.4)
Cholesterol, Total: 192 mg/dL (ref 100–199)
HDL: 37 mg/dL — ABNORMAL LOW (ref >39)
LDL Calculated: 115 mg/dL — ABNORMAL HIGH (ref 0–99)
Triglycerides: 199 mg/dL — ABNORMAL HIGH (ref 0–149)
VLDL Cholesterol Cal: 40 mg/dL (ref 5–40)

## 2019-02-25 LAB — HEMOGLOBIN A1C
Est. average glucose Bld gHb Est-mCnc: 163 mg/dL
Hgb A1c MFr Bld: 7.3 % — ABNORMAL HIGH (ref 4.8–5.6)

## 2019-02-25 MED ORDER — METOPROLOL TARTRATE 25 MG PO TABS: 25.0000 mg | ORAL_TABLET | Freq: Two times a day (BID) | ORAL | 3 refills | Status: DC

## 2019-02-25 NOTE — Patient Instructions (Addendum)
Medication Instructions:  START- Metoprolol 25 mg twice a day  If you need a refill on your cardiac medications before your next appointment, please call your pharmacy.  Labwork: None ordered  Testing/Procedures: Non-Cardiac CT Angiography (CTA), is a special type of CT scan that uses a computer to produce multi-dimensional views of major blood vessels throughout the body. In CT angiography, a contrast material is injected through an IV to help visualize the blood vessels  Special Instructions: Please arrive at the [facility] main entrance of [facility] at           AM (30-45 minutes prior to test start time)  [facility] [address] [place], [place] [address] [phone]  Proceed to the [facility] Radiology Department (First Floor).  Please follow these instructions carefully (unless otherwise directed):  Hold all erectile dysfunction medications at least 48 hours prior to test.  On the Night Before the Test: . Be sure to Drink plenty of water. . Do not consume any caffeinated/decaffeinated beverages or chocolate 12 hours prior to your test. . Do not take any antihistamines 12 hours prior to your test.   On the Day of the Test: . Drink plenty of water. Do not drink any water within one hour of the test. . Do not eat any food 4 hours prior to the test. . You may take your regular medications prior to the test.  . Take metoprolol (Lopressor) two hours prior to test.       After the Test: . Drink plenty of water. . After receiving IV contrast, you may experience a mild flushed feeling. This is normal. . On occasion, you may experience a mild rash up to 24 hours after the test. This is not dangerous. If this occurs, you can take Benadryl 25 mg and increase your fluid intake. . If you experience trouble breathing, this can be serious. If it is severe call 911 IMMEDIATELY. If it is mild, please call our office. . If you take any of these medications:  Glipizide/Metformin, Avandament, Glucavance, please do not take 48 hours after completing test.   Follow-Up: . Your physician recommends that you schedule a follow-up appointment in: After CTA   At [facility], you and your health needs are our priority.  As part of our continuing mission to provide you with exceptional heart care, we have created designated Provider Care Teams.  These Care Teams include your primary Cardiologist (physician) and Advanced Practice Providers (APPs -  Physician Assistants and Nurse Practitioners) who all work together to provide you with the care you need, when you need it.  Thank you for choosing [facility] at [facility]!!

## 2019-03-03 ENCOUNTER — Telehealth: Payer: Self-pay | Admitting: Cardiology

## 2019-03-03 NOTE — Telephone Encounter (Signed)
Left message to call and schedule cardiac ct  °

## 2019-03-08 ENCOUNTER — Telehealth: Payer: Self-pay | Admitting: Cardiology

## 2019-03-08 NOTE — Telephone Encounter (Signed)
Encounter not needed

## 2019-03-09 NOTE — Telephone Encounter (Signed)
Pt CT schedule for 05/21

## 2019-03-10 ENCOUNTER — Telehealth (HOSPITAL_COMMUNITY): Payer: Self-pay | Admitting: Emergency Medicine

## 2019-03-10 NOTE — Telephone Encounter (Signed)
Reaching out to patient to offer assistance regarding upcoming cardiac imaging study; pt verbalizes understanding of appt date/time, parking situation and where to check in, pre-test NPO status and medications ordered, and verified current allergies; name and call back number provided for further questions should they arise Rockwell Alexandria RN Navigator Cardiac Imaging [facility] [phone] office [phone] cell  Pt denies COVID19 symptoms, verbalized understanding of visitor policy  Will take 2 metoprolol prior to test, avoiding metformin today and tomorrow

## 2019-03-11 ENCOUNTER — Ambulatory Visit (HOSPITAL_COMMUNITY)
Admission: RE | Admit: 2019-03-11 | Discharge: 2019-03-11 | Disposition: A | Discharge status: Home / Self Care | Payer: Medicaid Other | Source: Ambulatory Visit | Attending: Cardiology | Admitting: Cardiology

## 2019-03-11 ENCOUNTER — Other Ambulatory Visit: Payer: Self-pay

## 2019-03-11 ENCOUNTER — Encounter (HOSPITAL_COMMUNITY): Payer: Self-pay

## 2019-03-11 DIAGNOSIS — I200 Unstable angina: Secondary | ICD-10-CM

## 2019-03-11 MED ORDER — IOHEXOL 350 MG/ML SOLN
80.0000 mL | Freq: Once | INTRAVENOUS | Status: AC | PRN
  Administered 2019-03-11: 80 mL via INTRAVENOUS

## 2019-03-11 MED ORDER — NITROGLYCERIN 0.4 MG SL SUBL
0.4000 mg | SUBLINGUAL_TABLET | Freq: Once | SUBLINGUAL | Status: AC
  Administered 2019-03-11: 0.4 mg via SUBLINGUAL
  Filled 2019-03-11: qty 25

## 2019-03-11 MED ORDER — NITROGLYCERIN 0.4 MG SL SUBL
SUBLINGUAL_TABLET | SUBLINGUAL | Status: AC
  Filled 2019-03-11: qty 1

## 2019-03-11 MED ORDER — SODIUM CHLORIDE 0.9 % IV BOLUS
500.0000 mL | Freq: Once | INTRAVENOUS | Status: AC
Start: 2019-03-11 — End: 2019-03-11
  Administered 2019-03-11: 10:00:00 500 mL via INTRAVENOUS

## 2019-03-11 MED ORDER — SODIUM CHLORIDE 0.9 % IV BOLUS
500.0000 mL | Freq: Once | INTRAVENOUS | Status: AC
  Administered 2019-03-11: 500 mL via INTRAVENOUS

## 2019-03-11 NOTE — Progress Notes (Signed)
Patient here for CT heart, current blood pressure 92/64 and according to CT heart protocol patient doesn't met criteria for nitroglycerin. Dr. Oval Linsey listed as reader for CT heart, MD was called twice but no answer. Dr. Meda Coffee called and made aware of patient current blood pressure readings, 90/63, 92/64. Order given for 500cc bolus.

## 2019-03-11 NOTE — Progress Notes (Signed)
Dr. Delton See informed of patient blood pressure after 500cc fluid bolus. Blood pressure 84/58 left arm and 87/28 in right arm. Verbal order given from Dr. Delton See to proceed with scan and give patient one nitroglycerin for scan and give caffeine and 500cc bolus after exam. The plan was explained with patient and she is agreeable to proceed.

## 2019-03-11 NOTE — Progress Notes (Signed)
500cc bolus infusion complete. Patient states she feels fine. Denies dizziness or lightheadedness. IV removed and patient discharged home.

## 2019-03-16 ENCOUNTER — Telehealth: Payer: Self-pay | Admitting: Cardiology

## 2019-03-16 NOTE — Telephone Encounter (Signed)
New message   Patient is calling for results of CT Cardiac. Please call.

## 2019-03-17 NOTE — Telephone Encounter (Signed)
Spoke with pt about her CTA, pt stated she is still having chest pain daily and want to know what is the cause of her pain since her CTA is normal, please advised

## 2019-03-17 NOTE — Telephone Encounter (Signed)
Nonanginal chest pain.  She needs to see her primary care for follow up.

## 2019-03-23 NOTE — Telephone Encounter (Signed)
Leave message for pt to call back 

## 2019-04-05 NOTE — Telephone Encounter (Signed)
Leave detailed message of Dr Percival Spanish recommendations on pt voicemail

## 2020-05-01 ENCOUNTER — Ambulatory Visit: Payer: Self-pay | Admitting: Physician Assistant

## 2020-05-04 ENCOUNTER — Encounter: Payer: Self-pay | Admitting: Physician Assistant

## 2020-05-04 ENCOUNTER — Ambulatory Visit (INDEPENDENT_AMBULATORY_CARE_PROVIDER_SITE_OTHER): Payer: Medicaid Other | Admitting: Physician Assistant

## 2020-05-04 ENCOUNTER — Other Ambulatory Visit: Payer: Self-pay

## 2020-05-04 VITALS — BP 104/70 | HR 79 | Temp 98.3°F | Resp 20 | Ht 63.0 in | Wt 176.0 lb

## 2020-05-04 DIAGNOSIS — E039 Hypothyroidism, unspecified: Secondary | ICD-10-CM | POA: Diagnosis not present

## 2020-05-04 DIAGNOSIS — E118 Type 2 diabetes mellitus with unspecified complications: Secondary | ICD-10-CM | POA: Diagnosis not present

## 2020-05-04 DIAGNOSIS — R072 Precordial pain: Secondary | ICD-10-CM

## 2020-05-04 LAB — BAYER DCA HB A1C WAIVED: HB A1C (BAYER DCA - WAIVED): 6.6 % (ref <7.0)

## 2020-05-04 MED ORDER — METFORMIN HCL 500 MG PO TABS: 500.0000 mg | ORAL_TABLET | Freq: Two times a day (BID) | ORAL | 1 refills | Status: DC

## 2020-05-04 MED ORDER — LEVOTHYROXINE SODIUM 100 MCG PO TABS: 100.0000 ug | ORAL_TABLET | Freq: Every day | ORAL | 1 refills | Status: DC

## 2020-05-04 MED ORDER — VARENICLINE TARTRATE 0.5 MG PO TABS: 0.5000 mg | ORAL_TABLET | Freq: Two times a day (BID) | ORAL | 0 refills | Status: DC

## 2020-05-04 NOTE — Progress Notes (Signed)
  Subjective:     Patient ID: Jaime Carter, female   DOB: 28-Jun-1976, 44 y.o.   MRN: [id number]  HPI Pt here to establish Care Prev pt of Dr Laureen Ochs PMH of NIDDM,hypothyroidism, and intermit chest pain Sates had full WU regarding chest pain and all negative She has been off all of her meds for at least 6 months due to lack of regular provider Has had some recent fatigue issues BS reading at home have been in the 160 range  Review of Systems  Constitutional: Positive for fatigue. Negative for activity change, appetite change and unexpected weight change.  Respiratory: Negative.   Cardiovascular: Positive for chest pain and palpitations. Negative for leg swelling.  Gastrointestinal: Negative.        Objective:   Physical Exam Vitals and nursing note reviewed.  Constitutional:      General: She is not in acute distress.    Appearance: Normal appearance. She is not ill-appearing or toxic-appearing.  Neck:     Vascular: No carotid bruit.     Comments: Thyroid non plap Cardiovascular:     Rate and Rhythm: Normal rate and regular rhythm.     Pulses: Normal pulses.     Heart sounds: Normal heart sounds. No murmur heard.   Pulmonary:     Effort: Pulmonary effort is normal.     Comments: Sl exp wheeze Musculoskeletal:     Cervical back: Neck supple. No rigidity or tenderness.  Lymphadenopathy:     Cervical: No cervical adenopathy.  Neurological:     Mental Status: She is alert.   CMP/A1C     Assessment:     1. Substernal chest pain   2. Hypothyroidism, unspecified type   3. Type 2 diabetes mellitus with unspecified complications (HCC)        Plan:     Restart the Levoxyl and titrate back to per day F/U for lab test in 6 weeks and further titration pending labs Restart Glucophage today 500mg  qd given A1C of 6.6 Have her f/u with Card regarding CP Given current BP will not restart Lopressor Stressed smoking cessation Chantix rx which has worked well in the  past F/U in 4-6 weeks

## 2020-05-04 NOTE — Patient Instructions (Signed)

## 2020-05-05 LAB — CMP14+EGFR
ALT: 12 IU/L (ref 0–32)
AST: 20 IU/L (ref 0–40)
Albumin/Globulin Ratio: 2 (ref 1.2–2.2)
Albumin: 4.4 g/dL (ref 3.8–4.8)
Alkaline Phosphatase: 52 IU/L (ref 48–121)
BUN/Creatinine Ratio: 8 — ABNORMAL LOW (ref 9–23)
BUN: 6 mg/dL (ref 6–24)
Bilirubin Total: 0.7 mg/dL (ref 0.0–1.2)
CO2: 23 mmol/L (ref 20–29)
Calcium: 9.7 mg/dL (ref 8.7–10.2)
Chloride: 105 mmol/L (ref 96–106)
Creatinine, Ser: 0.75 mg/dL (ref 0.57–1.00)
GFR calc Af Amer: 113 mL/min/{1.73_m2} (ref >59)
GFR calc non Af Amer: 98 mL/min/{1.73_m2} (ref >59)
Globulin, Total: 2.2 g/dL (ref 1.5–4.5)
Glucose: 142 mg/dL — ABNORMAL HIGH (ref 65–99)
Potassium: 4 mmol/L (ref 3.5–5.2)
Sodium: 143 mmol/L (ref 134–144)
Total Protein: 6.6 g/dL (ref 6.0–8.5)

## 2020-05-31 ENCOUNTER — Ambulatory Visit: Payer: Medicaid Other | Admitting: Nurse Practitioner

## 2020-06-07 ENCOUNTER — Other Ambulatory Visit: Payer: Self-pay

## 2020-06-07 ENCOUNTER — Ambulatory Visit (INDEPENDENT_AMBULATORY_CARE_PROVIDER_SITE_OTHER): Payer: Medicaid Other | Admitting: Family Medicine

## 2020-06-07 ENCOUNTER — Encounter: Payer: Self-pay | Admitting: Family Medicine

## 2020-06-07 VITALS — BP 105/73 | HR 84 | Temp 97.9°F | Ht 63.0 in | Wt 177.0 lb

## 2020-06-07 DIAGNOSIS — K219 Gastro-esophageal reflux disease without esophagitis: Secondary | ICD-10-CM

## 2020-06-07 DIAGNOSIS — R110 Nausea: Secondary | ICD-10-CM | POA: Diagnosis not present

## 2020-06-07 DIAGNOSIS — E669 Obesity, unspecified: Secondary | ICD-10-CM

## 2020-06-07 DIAGNOSIS — E785 Hyperlipidemia, unspecified: Secondary | ICD-10-CM

## 2020-06-07 DIAGNOSIS — Z8269 Family history of other diseases of the musculoskeletal system and connective tissue: Secondary | ICD-10-CM

## 2020-06-07 DIAGNOSIS — R109 Unspecified abdominal pain: Secondary | ICD-10-CM

## 2020-06-07 DIAGNOSIS — R413 Other amnesia: Secondary | ICD-10-CM

## 2020-06-07 DIAGNOSIS — F411 Generalized anxiety disorder: Secondary | ICD-10-CM

## 2020-06-07 DIAGNOSIS — E1165 Type 2 diabetes mellitus with hyperglycemia: Secondary | ICD-10-CM

## 2020-06-07 DIAGNOSIS — R1310 Dysphagia, unspecified: Secondary | ICD-10-CM

## 2020-06-07 DIAGNOSIS — E039 Hypothyroidism, unspecified: Secondary | ICD-10-CM

## 2020-06-07 DIAGNOSIS — F332 Major depressive disorder, recurrent severe without psychotic features: Secondary | ICD-10-CM

## 2020-06-07 DIAGNOSIS — Z720 Tobacco use: Secondary | ICD-10-CM

## 2020-06-07 DIAGNOSIS — R232 Flushing: Secondary | ICD-10-CM

## 2020-06-07 MED ORDER — ONDANSETRON 4 MG PO TBDP: 4.0000 mg | ORAL_TABLET | Freq: Three times a day (TID) | ORAL | 0 refills | Status: DC | PRN

## 2020-06-07 MED ORDER — OMEPRAZOLE 20 MG PO CPDR: 20.0000 mg | DELAYED_RELEASE_CAPSULE | Freq: Every day | ORAL | 3 refills | Status: DC

## 2020-06-07 NOTE — Patient Instructions (Signed)
glucosamine chondroitin for joints  estroven for menopause
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
## 2020-06-09 ENCOUNTER — Other Ambulatory Visit: Payer: Self-pay | Admitting: Physician Assistant

## 2020-06-10 DIAGNOSIS — E119 Type 2 diabetes mellitus without complications: Secondary | ICD-10-CM | POA: Insufficient documentation

## 2020-06-10 DIAGNOSIS — E039 Hypothyroidism, unspecified: Secondary | ICD-10-CM | POA: Insufficient documentation

## 2020-06-10 DIAGNOSIS — K219 Gastro-esophageal reflux disease without esophagitis: Secondary | ICD-10-CM | POA: Insufficient documentation

## 2020-06-16 ENCOUNTER — Encounter: Payer: Self-pay | Admitting: Internal Medicine

## 2020-06-27 ENCOUNTER — Telehealth: Payer: Self-pay | Admitting: Family Medicine

## 2020-06-27 DIAGNOSIS — F332 Major depressive disorder, recurrent severe without psychotic features: Secondary | ICD-10-CM

## 2020-06-27 DIAGNOSIS — F411 Generalized anxiety disorder: Secondary | ICD-10-CM

## 2020-06-27 MED ORDER — DESVENLAFAXINE SUCCINATE ER 25 MG PO TB24: 25.0000 mg | ORAL_TABLET | Freq: Every day | ORAL | 2 refills | Status: DC

## 2020-06-27 NOTE — Telephone Encounter (Signed)
Patient aware and verbalized understanding. Patient is agreeable she uses the drug store in [place]

## 2020-06-27 NOTE — Telephone Encounter (Signed)
Please let patient know I have reviewed her GeneSight test results and recommend a medication called Pristiq. She does not have very many medications that are going to work well for her. Is she agreeable to this? We will put a copy of the results in the mail to her.

## 2020-07-25 ENCOUNTER — Ambulatory Visit: Payer: Medicaid Other | Admitting: Family Medicine

## 2020-07-25 ENCOUNTER — Other Ambulatory Visit: Payer: Self-pay

## 2020-07-25 ENCOUNTER — Encounter: Payer: Self-pay | Admitting: Family Medicine

## 2020-07-25 VITALS — BP 100/66 | HR 72 | Temp 97.7°F | Ht 63.0 in | Wt 175.0 lb

## 2020-07-25 DIAGNOSIS — E1165 Type 2 diabetes mellitus with hyperglycemia: Secondary | ICD-10-CM

## 2020-07-25 DIAGNOSIS — F332 Major depressive disorder, recurrent severe without psychotic features: Secondary | ICD-10-CM

## 2020-07-25 DIAGNOSIS — R110 Nausea: Secondary | ICD-10-CM

## 2020-07-25 DIAGNOSIS — K219 Gastro-esophageal reflux disease without esophagitis: Secondary | ICD-10-CM

## 2020-07-25 DIAGNOSIS — F411 Generalized anxiety disorder: Secondary | ICD-10-CM

## 2020-07-25 MED ORDER — LEVOTHYROXINE SODIUM 100 MCG PO TABS: ORAL_TABLET | ORAL | 1 refills | Status: DC

## 2020-07-25 MED ORDER — ONDANSETRON 4 MG PO TBDP: 4.0000 mg | ORAL_TABLET | Freq: Three times a day (TID) | ORAL | 0 refills | Status: DC | PRN

## 2020-07-25 MED ORDER — OMEPRAZOLE 20 MG PO CPDR: 20.0000 mg | DELAYED_RELEASE_CAPSULE | Freq: Every day | ORAL | 1 refills | Status: DC

## 2020-07-25 MED ORDER — METFORMIN HCL 500 MG PO TABS: 500.0000 mg | ORAL_TABLET | Freq: Two times a day (BID) | ORAL | 1 refills | Status: DC

## 2020-07-25 MED ORDER — DESVENLAFAXINE SUCCINATE ER 50 MG PO TB24: 50.0000 mg | ORAL_TABLET | Freq: Every day | ORAL | 2 refills | Status: DC
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
## 2020-07-28 ENCOUNTER — Encounter: Payer: Self-pay | Admitting: Family Medicine

## 2020-07-28 DIAGNOSIS — F332 Major depressive disorder, recurrent severe without psychotic features: Secondary | ICD-10-CM | POA: Insufficient documentation

## 2020-07-28 DIAGNOSIS — F411 Generalized anxiety disorder: Secondary | ICD-10-CM | POA: Insufficient documentation

## 2020-07-31 NOTE — Progress Notes (Signed)
Spoke with [employer] and she sent me to a VM to leave a message about the compound.  LMTCB

## 2020-07-31 NOTE — Progress Notes (Signed)
States it is on back order from every vendor and they do not know who can get it.

## 2020-08-01 NOTE — Progress Notes (Signed)
Patient aware and verbalizes understanding. 

## 2020-08-01 NOTE — Progress Notes (Signed)
Please make patient aware that screen cream is on back order and the pharmacy is not sure where they can get it.

## 2020-08-07 ENCOUNTER — Telehealth: Payer: Self-pay | Admitting: *Deleted

## 2020-08-07 NOTE — Telephone Encounter (Signed)
Jaime Carter, you are scheduled for a virtual visit with your provider today.  Just as we do with appointments in the office, we must obtain your consent to participate.  Your consent will be active for this visit and any virtual visit you may have with one of our providers in the next 365 days.  If you have a MyChart account, I can also send a copy of this consent to you electronically.  All virtual visits are billed to your insurance company just like a traditional visit in the office.  As this is a virtual visit, video technology does not allow for your provider to perform a traditional examination.  This may limit your provider's ability to fully assess your condition.  If your provider identifies any concerns that need to be evaluated in person or the need to arrange testing such as labs, EKG, etc, we will make arrangements to do so.  Although advances in technology are sophisticated, we cannot ensure that it will always work on either your end or our end.  If the connection with a video visit is poor, we may have to switch to a telephone visit.  With either a video or telephone visit, we are not always able to ensure that we have a secure connection.   I need to obtain your verbal consent now.   Are you willing to proceed with your visit today?

## 2020-08-07 NOTE — Telephone Encounter (Signed)
Pt consented to a virtual visit. 

## 2020-08-08 ENCOUNTER — Telehealth (INDEPENDENT_AMBULATORY_CARE_PROVIDER_SITE_OTHER): Payer: Medicaid Other | Admitting: Gastroenterology

## 2020-08-08 ENCOUNTER — Encounter: Payer: Self-pay | Admitting: Gastroenterology

## 2020-08-08 ENCOUNTER — Other Ambulatory Visit: Payer: Self-pay

## 2020-08-08 DIAGNOSIS — K219 Gastro-esophageal reflux disease without esophagitis: Secondary | ICD-10-CM

## 2020-08-08 NOTE — Progress Notes (Signed)
Patient could not get virtual visit to work. Since she is a new patient, video required. She was rescheduled.

## 2020-09-05 ENCOUNTER — Ambulatory Visit (INDEPENDENT_AMBULATORY_CARE_PROVIDER_SITE_OTHER): Payer: Medicaid Other | Admitting: Nurse Practitioner

## 2020-09-05 ENCOUNTER — Encounter: Payer: Self-pay | Admitting: Nurse Practitioner

## 2020-09-05 DIAGNOSIS — F411 Generalized anxiety disorder: Secondary | ICD-10-CM | POA: Diagnosis not present

## 2020-09-05 DIAGNOSIS — F332 Major depressive disorder, recurrent severe without psychotic features: Secondary | ICD-10-CM

## 2020-09-05 NOTE — Progress Notes (Signed)
Virtual Visit via telephone Note Due to COVID-19 pandemic this visit was conducted virtually. This visit type was conducted due to national recommendations for restrictions regarding the COVID-19 Pandemic (e.g. social distancing, sheltering in place) in an effort to limit this patient's exposure and mitigate transmission in our community. All issues noted in this document were discussed and addressed.  A physical exam was not performed with this format.  I connected with Jaime Carter on 09/05/20 at 12:12 pm by telephone and verified that I am speaking with the correct person using two identifiers. [name] is currently located in her car and someone  is currently with patient during visit. The provider, Daryll Drown, NP is located in their office at time of visit.  I discussed the limitations, risks, security and privacy concerns of performing an evaluation and management service by telephone and the availability of in person appointments. I also discussed with the patient that there may be a patient responsible charge related to this service. The patient expressed understanding and agreed to proceed.   History and Present Illness:  HPI  Anxiety: Patient complains of anxiety disorder.  She has the following symptoms: difficulty concentrating, fatigue, feelings of losing control, irritable. Onset of symptoms was approximately 1 month ago, unchanged since that time. She denies current suicidal and homicidal ideation. Family history significant for depression.Possible organic causes contributing are: none. Risk factors: previous episode of depression Previous treatment includes pristiq.  She complains of the following side effects from the treatment: decreased libido,     Review of Systems  Psychiatric/Behavioral: Positive for depression. Negative for suicidal ideas. The patient is nervous/anxious.   All other systems reviewed and are  negative.    Observations/Objective: Tele-visit  Assessment and Plan:  Severe episode of recurrent major depressive disorder, without psychotic features (HCC) Patient is following up for depression that is not well controlled on current medication. Reports that medication helps with her anger but keeps her very laid back all day and  not wanting to do anything or complete tasks. Patient is also reporting that her sex drive is next to nothing and would like to get a cream ordered to the pharmacy that was prescribed for her by her OB/GYN few years prior. Talking to patient over the phone about different options of medication after completing GAD-7 and PHQ-9. Slight improvement on PHQ-9 from a month ago but worsening anxiety on GAD-7 from previous month. Patient became upset and ended conversation. "Patient states "I will wait for my PCP to return because I am done with this conversation". Patient hung up the phone.   Follow Up Instructions: Patient wants to wait for PCP to return.   I discussed the assessment and treatment plan with the patient. The patient was provided an opportunity to ask questions and all were answered. The patient agreed with the plan and demonstrated an understanding of the instructions.   The patient was advised to call back or seek an in-person evaluation if the symptoms worsen or if the condition fails to improve as anticipated.  The above assessment and management plan was discussed with the patient. The patient verbalized understanding of and has agreed to the management plan. Patient is aware to call the clinic if symptoms persist or worsen. Patient is aware when to return to the clinic for a follow-up visit. Patient educated on when it is appropriate to go to the emergency department.   Time call ended:    I provided 16 minutes of  non-face-to-face time during this encounter.    Daryll Drown, NP

## 2020-09-05 NOTE — Assessment & Plan Note (Signed)
Patient is following up for depression that is not well controlled on current medication. Reports that medication helps with her anger but keeps her very laid back all day and  not wanting to do anything or complete tasks. Patient is also reporting that her sex drive is next to nothing and would like to get a cream ordered to the pharmacy that was prescribed for her by her OB/GYN few years prior. Talking to patient over the phone about different options of medication after completing GAD-7 and PHQ-9. Slight improvement on PHQ-9 from a month ago but worsening anxiety on GAD-7 from previous month. Patient became upset and ended conversation. "Patient states "I will wait for my PCP to return because I am done with this conversation". Patient hung up the phone.

## 2020-09-10 IMAGING — CT CT HEAR MORPH WITH CTA COR WITH SCORE WITH CA WITH CONTRAST AND
4 of 7 series · 8 of 20 positions shown, 9 images · IV contrast (APPLIED)
Comparison: None.
COMPARISON: None.

Addendum:
EXAM:
OVER-READ INTERPRETATION  CT CHEST

The following report is an over-read performed by radiologist Dr.
Yamanishi Grand [REDACTED] on 03/11/2019. This over-read
does not include interpretation of cardiac or coronary anatomy or
pathology. The coronary CTA interpretation by the cardiologist is
attached.
CLINICAL DATA: 42F with diabetes, hyperlipidemia and atypical chest
pain.
Cardiac/Coronary  CT
TECHNIQUE: The patient was scanned on a Phillips Force scanner.

[Series 6: best diast 74 % · axial · 0.39mm/px · z∈[-193,-155]mm · 2 of 288 slices shown]
[im 96/288  vessel]
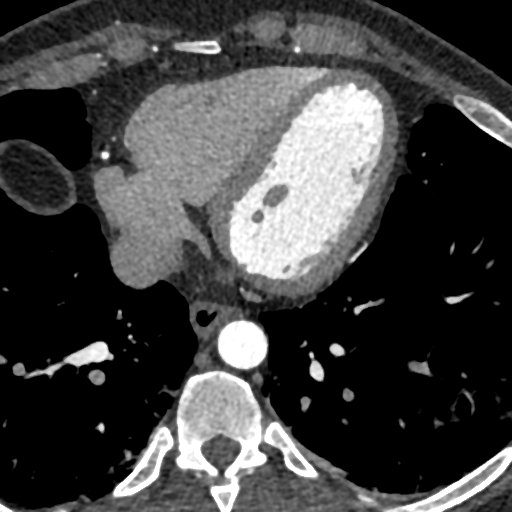
[im 192/288  vessel]
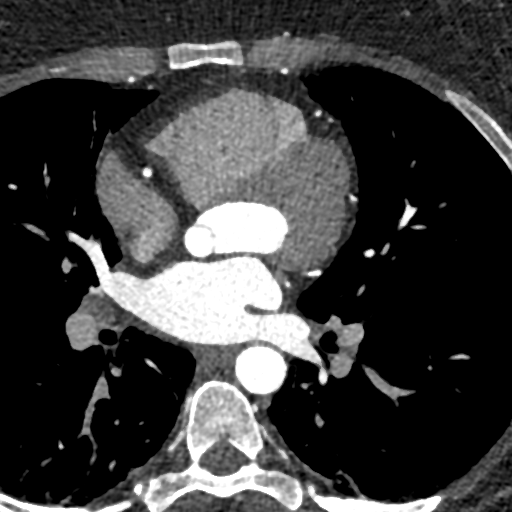

[Series 7: best syst · axial · 0.39mm/px · z∈[-193,-155]mm · 2 of 288 slices shown, 3 images]
[im 96/288  vessel]
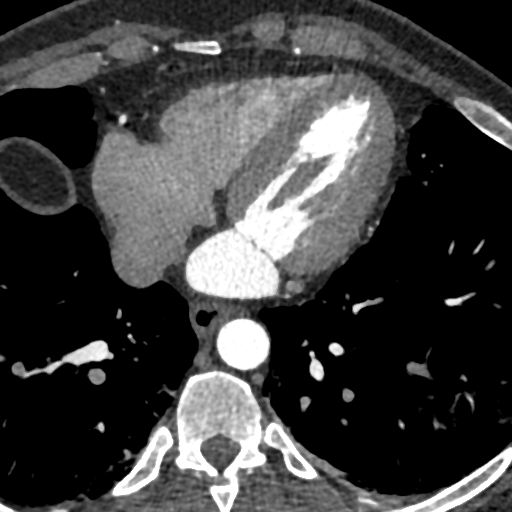
[im 96/288  lung]
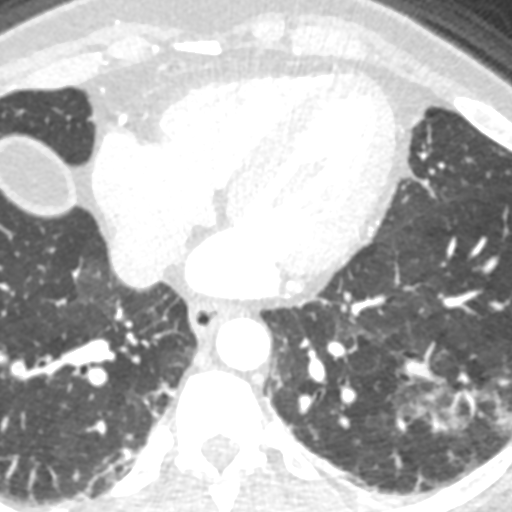
[im 192/288  vessel]
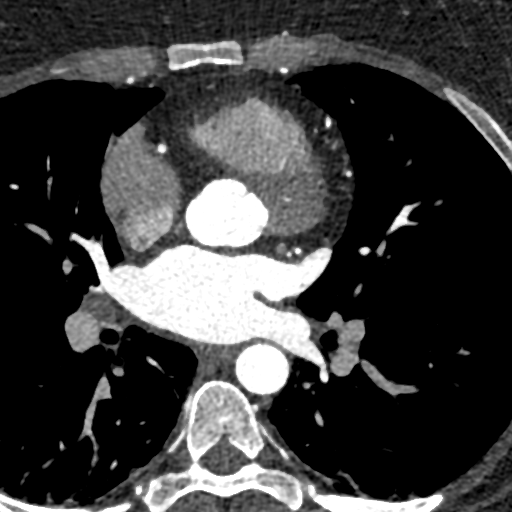

[Series 8: ts diast sharp 74 % · axial · 0.39mm/px · z∈[-193,-155]mm · 2 of 288 slices shown]
[im 96/288  lung]
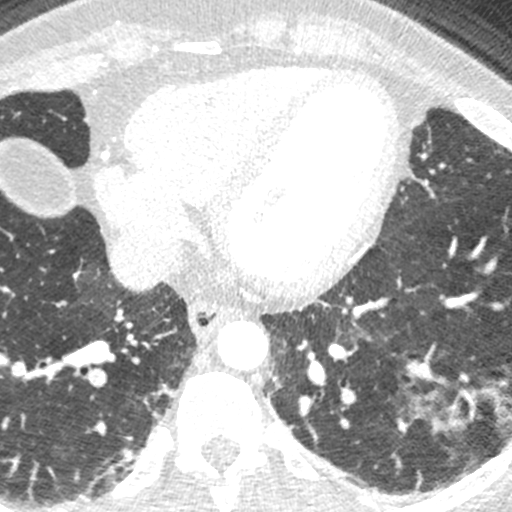
[im 192/288  lung]
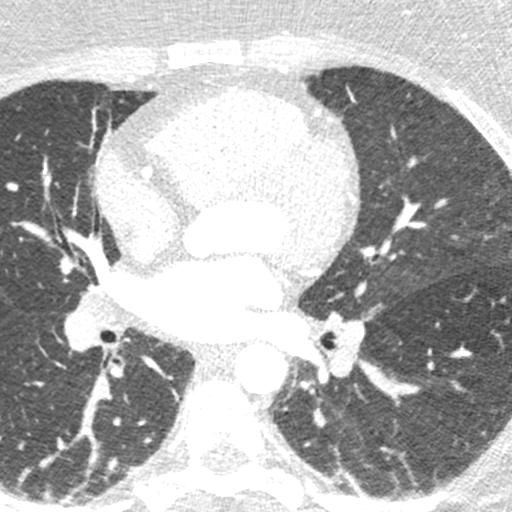

[Series 9: ts syst sharp · axial · 0.39mm/px · z∈[-193,-155]mm · 2 of 288 slices shown]
[im 96/288  lung]
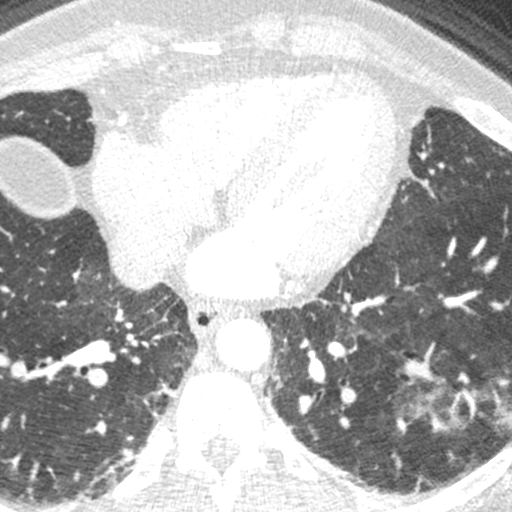
[im 192/288  lung]
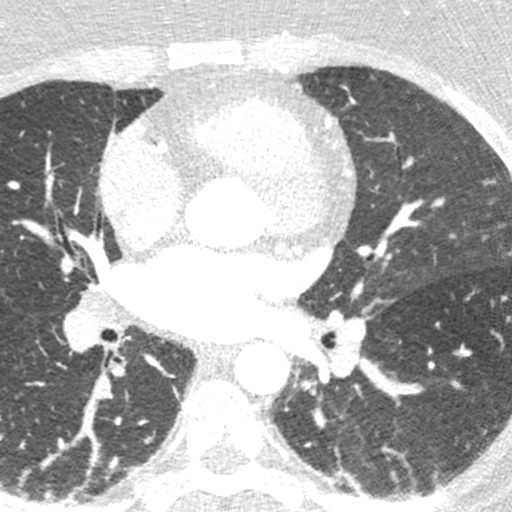

[8 of 20 positions shown; findings below may reference images not displayed]

FINDINGS: Vascular: Heart is normal size.  Visualized aorta normal caliber.

Mediastinum/Nodes: No adenopathy in the lower mediastinum or hila.

Lungs/Pleura: Linear dependent atelectasis or scarring. No
effusions.

Upper Abdomen: Imaging into the upper abdomen shows no acute
findings.

Musculoskeletal: Chest wall soft tissues are unremarkable. No acute
bony abnormality.
IMPRESSION: No acute or significant extracardiac abnormality. Dependent
bibasilar scarring or atelectasis.
FINDINGS: A 120 kV prospective scan was triggered in the descending thoracic
aorta at 111 HU's. Axial non-contrast 3 mm slices were carried out
through the heart. The data set was analyzed on a dedicated work
station and scored using the Agatson method. Gantry rotation speed
was 250 msecs and collimation was .6 mm. No beta blockade and 0.8 mg
of sl NTG was given. The 3D data set was reconstructed in 5%
intervals of the 67-82 % of the R-R cycle. Diastolic phases were
analyzed on a dedicated work station using MPR, MIP and VRT modes.
The patient received 80 cc of contrast.

Aorta: Normal size. Ascending aorta 2.8 cm. No calcifications. No
dissection.

Aortic Valve:  Trileaflet.  No calcifications.

Coronary Arteries:  Normal coronary origin.  Right dominance.

RCA is a large dominant artery that gives rise to PDA and two GASFITER
branches. There is no plaque.

Left main is a large artery that gives rise to LAD and LCX arteries.

LAD is a large vessel that has no plaque. There are two diagonal
vessels without plaque.

LCX is a non-dominant artery that gives rise to one large OM1
branch. There is no plaque.

Other findings:

Normal pulmonary vein drainage into the left atrium.

Normal let atrial appendage without a thrombus.

Normal size of the pulmonary artery.
IMPRESSION: 1. Coronary calcium score of 0. This was 0 percentile for age and
sex matched control.

2. Normal coronary origin with right dominance.

3. No evidence of CAD.

*** End of Addendum ***
EXAM:
OVER-READ INTERPRETATION  CT CHEST

The following report is an over-read performed by radiologist Dr.
Yamanishi Grand [REDACTED] on 03/11/2019. This over-read
does not include interpretation of cardiac or coronary anatomy or
pathology. The coronary CTA interpretation by the cardiologist is
attached.
FINDINGS: Vascular: Heart is normal size.  Visualized aorta normal caliber.

Mediastinum/Nodes: No adenopathy in the lower mediastinum or hila.

Lungs/Pleura: Linear dependent atelectasis or scarring. No
effusions.

Upper Abdomen: Imaging into the upper abdomen shows no acute
findings.

Musculoskeletal: Chest wall soft tissues are unremarkable. No acute
bony abnormality.
IMPRESSION: No acute or significant extracardiac abnormality. Dependent
bibasilar scarring or atelectasis.

## 2020-09-13 ENCOUNTER — Encounter: Payer: Self-pay | Admitting: Internal Medicine

## 2020-09-13 ENCOUNTER — Ambulatory Visit: Payer: Medicaid Other | Admitting: Gastroenterology

## 2020-11-23 ENCOUNTER — Other Ambulatory Visit: Payer: Self-pay | Admitting: Family Medicine

## 2020-11-23 DIAGNOSIS — F411 Generalized anxiety disorder: Secondary | ICD-10-CM

## 2020-11-23 DIAGNOSIS — F332 Major depressive disorder, recurrent severe without psychotic features: Secondary | ICD-10-CM

## 2021-06-27 ENCOUNTER — Other Ambulatory Visit: Payer: Self-pay

## 2021-06-27 ENCOUNTER — Telehealth: Payer: Self-pay

## 2021-06-27 DIAGNOSIS — N6310 Unspecified lump in the right breast, unspecified quadrant: Secondary | ICD-10-CM

## 2021-06-27 DIAGNOSIS — N6452 Nipple discharge: Secondary | ICD-10-CM

## 2021-06-27 NOTE — Telephone Encounter (Signed)
Patient called and stated that she has bilateral breast discharge, clear, milky ongoing x 18 years, since the birth of her youngest child. Patient also states she has a Right breast knot in the lower, outer right breast x 1 year or more, and has noticed an increase in size x 1 month, and has some discomfort on Right side (breast, axillary) when wearing a wire bra. Patient states the knot on the right breast is tender to touch and has some sharp pain through the nipple that comes and goes, rated pain at lever 3/10. Patient informed will be scheduled for a diagnostic appointment with [facility], and [facility], will call back with appointment information.  Patient verbalized understanding.

## 2022-01-23 ENCOUNTER — Ambulatory Visit (INDEPENDENT_AMBULATORY_CARE_PROVIDER_SITE_OTHER): Payer: Self-pay | Admitting: Nurse Practitioner

## 2022-01-23 ENCOUNTER — Encounter: Payer: Self-pay | Admitting: Nurse Practitioner

## 2022-01-23 ENCOUNTER — Ambulatory Visit: Payer: Self-pay | Admitting: Family Medicine

## 2022-01-23 VITALS — BP 96/65 | HR 73 | Ht 63.0 in | Wt 175.0 lb

## 2022-01-23 DIAGNOSIS — H6523 Chronic serous otitis media, bilateral: Secondary | ICD-10-CM | POA: Insufficient documentation

## 2022-01-23 MED ORDER — AMOXICILLIN 875 MG PO TABS: 875.0000 mg | ORAL_TABLET | Freq: Two times a day (BID) | ORAL | 0 refills | Status: DC

## 2022-01-23 MED ORDER — HYDROCORTISONE 1 % EX LOTN: 1.0000 "application " | TOPICAL_LOTION | Freq: Two times a day (BID) | CUTANEOUS | 0 refills | Status: DC

## 2022-01-23 MED ORDER — FLUCONAZOLE 150 MG PO TABS: 150.0000 mg | ORAL_TABLET | Freq: Once | ORAL | 0 refills | Status: AC

## 2022-01-23 NOTE — Patient Instructions (Signed)
Otitis Media, Adult Otitis media is a condition in which the middle ear is red and swollen (inflamed) and full of fluid. The middle ear is the part of the ear that contains bones for hearing as well as air that helps send sounds to the brain. The condition usually goes away on its own. What are the causes? This condition is caused by a blockage in the eustachian tube. This tube connects the middle ear to the back of the nose. It normally allows air into the middle ear. The blockage is caused by fluid or swelling. Problems that can cause blockage include: A cold or infection that affects the nose, mouth, or throat. Allergies. An irritant, such as tobacco smoke. Adenoids that have become large. The adenoids are soft tissue located in the back of the throat, behind the nose and the roof of the mouth. Growth or swelling in the upper part of the throat, just behind the nose (nasopharynx). Damage to the ear caused by a change in pressure. This is called barotrauma. What increases the risk? You are more likely to develop this condition if you: Smoke or are exposed to tobacco smoke. Have an opening in the roof of your mouth (cleft palate). Have acid reflux. Have problems in your body's defense system (immune system). What are the signs or symptoms? Symptoms of this condition include: Ear pain. Fever. Problems with hearing. Being tired. Fluid leaking from the ear. Ringing in the ear. How is this treated? This condition can go away on its own within 3-5 days. But if the condition is caused by germs (bacteria) and does not go away on its own, or if it keeps coming back, your doctor may: Give you antibiotic medicines. Give you medicines for pain. Follow these instructions at home: Take over-the-counter and prescription medicines only as told by your doctor. If you were prescribed an antibiotic medicine, take it as told by your doctor. Do not stop taking it even if you start to feel better. Keep  all follow-up visits. Contact a doctor if: You have bleeding from your nose. There is a lump on your neck. You are not feeling better in 5 days. You feel worse instead of better. Get help right away if: You have pain that is not helped with medicine. You have swelling, redness, or pain around your ear. You get a stiff neck. You cannot move part of your face (paralysis). You notice that the bone behind your ear hurts when you touch it. You get a very bad headache. Summary Otitis media means that the middle ear is red, swollen, and full of fluid. This condition usually goes away on its own. If the problem does not go away, treatment may be needed. You may be given medicines to treat the infection or to treat your pain. If you were prescribed an antibiotic medicine, take it as told by your doctor. Do not stop taking it even if you start to feel better. Keep all follow-up visits. This information is not intended to replace advice given to you by your health care provider. Make sure you discuss any questions you have with your health care provider. Document Revised: 01/15/2021 Document Reviewed: 01/15/2021 Elsevier Patient Education  2022 [employer]  

## 2022-01-23 NOTE — Assessment & Plan Note (Signed)
Symptoms unresolved in the past 1 year.  Referral to ENT completed, I started with amoxicillin 875 mg tablet by mouth twice daily for 7 days.  Hydrocortisone 1% cream externally use for itching and inflammation. ?Education provided to patient printed handouts given. ?Patient knows to follow-up with worsening unresolved symptoms. ?

## 2022-01-23 NOTE — Addendum Note (Signed)
Addended by: Daryll Drown on: 01/23/2022 02:55 PM ? ? Modules accepted: Orders ? ?
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
## 2022-01-30 ENCOUNTER — Telehealth: Payer: Self-pay | Admitting: Family Medicine

## 2022-01-30 NOTE — Telephone Encounter (Signed)
Pt does not want to be seen. She thinks the ATB is not strong enough. States that the drainage in ears is causing them to become raw. Pt would like to have this addressed with Britney when she returns on Friday.  ?

## 2022-01-30 NOTE — Telephone Encounter (Signed)
[facility] patient. On vacation ?

## 2022-01-30 NOTE — Telephone Encounter (Signed)
?  Incoming Patient Call ? ?01/30/2022 ? ?What symptoms do you have? Ears are draining ? ?How long have you been sick? Almost a year ? ?Have you been seen for this problem? Yes, pt was seen last week and was referred to ENT, but they can not get her in until July 31, antibiotics are working but ears are still draining and she has one more pill to take wants to know what to do in the meantime? ? ?If your provider decides to give you a prescription, which pharmacy would you like for it to be sent to? [facility] [place] ? ? ?Patient informed that this information will be sent to the clinical staff for review and that they should receive a follow up call. ? ?

## 2022-01-30 NOTE — Telephone Encounter (Signed)
Should be reevaluated if symptoms have not improved.  ?

## 2022-01-31 NOTE — Telephone Encounter (Signed)
I have not seen this patient since 2021. Needs appointment.  ?

## 2022-01-31 NOTE — Telephone Encounter (Signed)
Patient aware and would like billing to call her about price.  ?

## 2022-02-21 ENCOUNTER — Ambulatory Visit (INDEPENDENT_AMBULATORY_CARE_PROVIDER_SITE_OTHER): Payer: Self-pay | Admitting: Family

## 2022-02-21 ENCOUNTER — Encounter: Payer: Self-pay | Admitting: Family

## 2022-02-21 ENCOUNTER — Ambulatory Visit: Payer: Self-pay | Admitting: Family Medicine

## 2022-02-21 VITALS — BP 112/75 | HR 71 | Temp 97.6°F | Resp 16 | Ht 63.0 in | Wt 172.6 lb

## 2022-02-21 DIAGNOSIS — H66006 Acute suppurative otitis media without spontaneous rupture of ear drum, recurrent, bilateral: Secondary | ICD-10-CM

## 2022-02-21 DIAGNOSIS — H6063 Unspecified chronic otitis externa, bilateral: Secondary | ICD-10-CM

## 2022-02-21 MED ORDER — HYDROCORTISONE-ACETIC ACID 1-2 % OT SOLN: 4.0000 [drp] | OTIC | 0 refills | Status: DC

## 2022-02-21 MED ORDER — AMOXICILLIN-POT CLAVULANATE 875-125 MG PO TABS: 1.0000 | ORAL_TABLET | Freq: Two times a day (BID) | ORAL | 0 refills | Status: DC

## 2022-02-21 MED ORDER — AMOXICILLIN-POT CLAVULANATE 875-125 MG PO TABS: 1.0000 | ORAL_TABLET | Freq: Two times a day (BID) | ORAL | 0 refills | Status: AC

## 2022-02-21 MED ORDER — NEOMYCIN-POLYMYXIN-HC 3.5-10000-1 OT SOLN: 4.0000 [drp] | Freq: Four times a day (QID) | OTIC | 0 refills | Status: AC

## 2022-02-21 MED ORDER — HYDROCORTISONE-ACETIC ACID 1-2 % OT SOLN: 4.0000 [drp] | OTIC | 0 refills | Status: AC

## 2022-02-21 NOTE — Progress Notes (Signed)
? ?Subjective:  ? ? Patient ID: Jaime Carter, female    DOB: 01/06/1976, 46 y.o.   MRN: [id number] ? ?Chief Complaint  ?Patient presents with  ? Ear Pain  ?  Recheck ears  ? ?PT presents to the office today with recurrent otitis externa for over a year. She was seen last month and given Amoxicillin 875 mg  with mild relief.  ?Otalgia  ?There is pain in both ears. This is a chronic problem. The current episode started more than 1 month ago. The problem occurs constantly. The problem has been gradually improving. There has been no fever. The pain is at a severity of 5/10. The pain is mild. Associated symptoms include coughing, ear discharge, headaches, hearing loss and rhinorrhea. Pertinent negatives include no sore throat. She has tried NSAIDs for the symptoms. The treatment provided mild relief.  ? ? ? ?Review of Systems  ?HENT:  Positive for ear discharge, ear pain, hearing loss and rhinorrhea. Negative for sore throat.   ?Respiratory:  Positive for cough.   ?Neurological:  Positive for headaches.  ?All other systems reviewed and are negative. ? ?   ?Objective:  ? Physical Exam ?Vitals reviewed.  ?Constitutional:   ?   General: She is not in acute distress. ?   Appearance: She is well-developed.  ?HENT:  ?   Head: Normocephalic and atraumatic.  ?   Right Ear: Drainage, swelling and tenderness present.  ?   Left Ear: Drainage, swelling and tenderness present.  No middle ear effusion.  ?   Ears:  ?   Comments: Purulent discharge bilateral ears ?Eyes:  ?   Pupils: Pupils are equal, round, and reactive to light.  ?Neck:  ?   Thyroid: No thyromegaly.  ?Cardiovascular:  ?   Rate and Rhythm: Normal rate and regular rhythm.  ?   Heart sounds: Normal heart sounds. No murmur heard. ?Pulmonary:  ?   Effort: Pulmonary effort is normal. No respiratory distress.  ?   Breath sounds: Normal breath sounds. No wheezing.  ?Abdominal:  ?   General: Bowel sounds are normal. There is no distension.  ?   Palpations: Abdomen is soft.  ?    Tenderness: There is no abdominal tenderness.  ?Musculoskeletal:     ?   General: No tenderness. Normal range of motion.  ?   Cervical back: Normal range of motion and neck supple.  ?Skin: ?   General: Skin is warm and dry.  ?Neurological:  ?   Mental Status: She is alert and oriented to person, place, and time.  ?   Cranial Nerves: No cranial nerve deficit.  ?   Deep Tendon Reflexes: Reflexes are normal and symmetric.  ?Psychiatric:     ?   Behavior: Behavior normal.     ?   Thought Content: Thought content normal.     ?   Judgment: Judgment normal.  ? ? ? ? ?BP 112/75   Pulse 71   Temp 97.6 ?F (36.4 ?C)   Resp 16   Ht 5\' 3"  (1.6 m)   Wt 172 lb 9.6 oz (78.3 kg)   LMP 06/21/2013   SpO2 95%   BMI 30.57 kg/m?  ? ?   ?Assessment & Plan:  ?Jaime Carter comes in today with chief complaint of Ear Pain (Recheck ears) ? ? ?Diagnosis and orders addressed: ? ?1. Chronic otitis externa of both ears, unspecified type ?- neomycin-polymyxin-hydrocortisone (CORTISPORIN) OTIC solution; Place 4 drops into both ears 4 (four)  times daily.  Dispense: 20 mL; Refill: 0 ?- acetic acid-hydrocortisone (VOSOL-HC) OTIC solution; Place 4 drops into both ears every 4 (four) hours.  Dispense: 10 mL; Refill: 0 ?- amoxicillin-clavulanate (AUGMENTIN) 875-125 MG tablet; Take 1 tablet by mouth 2 (two) times daily.  Dispense: 20 tablet; Refill: 0 ? ?2. Recurrent acute suppurative otitis media without spontaneous rupture of tympanic membrane of both sides ?- neomycin-polymyxin-hydrocortisone (CORTISPORIN) OTIC solution; Place 4 drops into both ears 4 (four) times daily.  Dispense: 20 mL; Refill: 0 ? ?Start Augmentin today ?Do Cortisporin drops ?Her ears look horrible! Try to keep clean  ?Keep follow up with ENT  ? ? ?Evelina Dun, FNP ? ? ?

## 2022-02-21 NOTE — Patient Instructions (Signed)
Otitis Externa  Otitis externa is an infection of the outer ear canal. The outer ear canal is the area between the outside of the ear and the eardrum. Otitis externa is sometimes called swimmer's ear. What are the causes? Common causes of this condition include: Swimming in dirty water. Moisture in the ear. An injury to the inside of the ear. An object stuck in the ear. A cut or scrape on the outside of the ear or in the ear canal. What increases the risk? You are more likely to develop this condition if you go swimming often. What are the signs or symptoms? The first symptom of this condition is often itching in the ear. Later symptoms of the condition include: Swelling of the ear. Redness in the ear. Ear pain. The pain may get worse when you pull on your ear. Pus coming from the ear. How is this diagnosed? This condition may be diagnosed by examining the ear and testing fluid from the ear for bacteria and funguses. How is this treated? This condition may be treated with: Antibiotic ear drops. These are often given for 10-14 days. Medicines to reduce itching and swelling. Follow these instructions at home: If you were prescribed antibiotic ear drops, use them as told by your health care provider. Do not stop using the antibiotic even if you start to feel better. Take over-the-counter and prescription medicines only as told by your health care provider. Avoid getting water in your ears as told by your health care provider. This may include avoiding swimming or water sports for a few days. Keep all follow-up visits. This is important. How is this prevented? Keep your ears dry. Use the corner of a towel to dry your ears after you swim or bathe. Avoid scratching or putting things in your ear. Doing these things can damage the ear canal or remove the protective wax that lines it, which makes it easier for bacteria and funguses to grow. Avoid swimming in lakes, polluted water, or swimming  pools that may not have enough chlorine. Contact a health care provider if: You have a fever. Your ear is still red, swollen, painful, or draining pus after 3 days. Your redness, swelling, or pain gets worse. You have a severe headache. Get help right away if: You have redness, swelling, and pain or tenderness in the area behind your ear. Summary Otitis externa is an infection of the outer ear canal. Common causes include swimming in dirty water, moisture in the ear, or a cut or scrape in the ear. Symptoms include pain, redness, and swelling of the ear canal. If you were prescribed antibiotic ear drops, use them as told by your health care provider. Do not stop using the antibiotic even if you start to feel better. This information is not intended to replace advice given to you by your health care provider. Make sure you discuss any questions you have with your health care provider. Document Revised: 12/20/2020 Document Reviewed: 12/20/2020 [employer] Patient Education  2023 [employer]
